# Patient Record
Sex: Female | Born: 1975 | Race: White | Hispanic: No | Marital: Single | Smoking: Never smoker
Health system: Southern US, Community
[De-identification: ages and names within clinical notes are randomized; demographics above are authoritative.]

## PROBLEM LIST (undated history)

## (undated) DIAGNOSIS — F4310 Post-traumatic stress disorder, unspecified: Secondary | ICD-10-CM

## (undated) DIAGNOSIS — M461 Sacroiliitis, not elsewhere classified: Secondary | ICD-10-CM

## (undated) DIAGNOSIS — R61 Generalized hyperhidrosis: Secondary | ICD-10-CM

## (undated) DIAGNOSIS — L811 Chloasma: Secondary | ICD-10-CM

## (undated) DIAGNOSIS — M1990 Unspecified osteoarthritis, unspecified site: Secondary | ICD-10-CM

## (undated) DIAGNOSIS — D249 Benign neoplasm of unspecified breast: Secondary | ICD-10-CM

## (undated) DIAGNOSIS — G4730 Sleep apnea, unspecified: Secondary | ICD-10-CM

## (undated) DIAGNOSIS — G4733 Obstructive sleep apnea (adult) (pediatric): Secondary | ICD-10-CM

## (undated) DIAGNOSIS — F909 Attention-deficit hyperactivity disorder, unspecified type: Secondary | ICD-10-CM

## (undated) DIAGNOSIS — G5700 Lesion of sciatic nerve, unspecified lower limb: Secondary | ICD-10-CM

## (undated) DIAGNOSIS — C801 Malignant (primary) neoplasm, unspecified: Secondary | ICD-10-CM

## (undated) DIAGNOSIS — I10 Essential (primary) hypertension: Secondary | ICD-10-CM

## (undated) DIAGNOSIS — T7840XA Allergy, unspecified, initial encounter: Secondary | ICD-10-CM

## (undated) DIAGNOSIS — F419 Anxiety disorder, unspecified: Secondary | ICD-10-CM

## (undated) DIAGNOSIS — R87619 Unspecified abnormal cytological findings in specimens from cervix uteri: Secondary | ICD-10-CM

## (undated) DIAGNOSIS — J301 Allergic rhinitis due to pollen: Secondary | ICD-10-CM

## (undated) DIAGNOSIS — F32A Depression, unspecified: Secondary | ICD-10-CM

## (undated) DIAGNOSIS — E559 Vitamin D deficiency, unspecified: Secondary | ICD-10-CM

## (undated) DIAGNOSIS — F329 Major depressive disorder, single episode, unspecified: Secondary | ICD-10-CM

## (undated) HISTORY — DX: Allergic rhinitis due to pollen: J30.1

## (undated) HISTORY — DX: Essential (primary) hypertension: I10

## (undated) HISTORY — PX: BREAST BIOPSY: SHX20

## (undated) HISTORY — DX: Lesion of sciatic nerve, unspecified lower limb: G57.00

## (undated) HISTORY — DX: Malignant (primary) neoplasm, unspecified: C80.1

## (undated) HISTORY — DX: Generalized hyperhidrosis: R61

## (undated) HISTORY — DX: Unspecified osteoarthritis, unspecified site: M19.90

## (undated) HISTORY — DX: Sacroiliitis, not elsewhere classified: M46.1

## (undated) HISTORY — DX: Unspecified abnormal cytological findings in specimens from cervix uteri: R87.619

## (undated) HISTORY — PX: BREAST SURGERY: SHX581

## (undated) HISTORY — DX: Obstructive sleep apnea (adult) (pediatric): G47.33

## (undated) HISTORY — DX: Sleep apnea, unspecified: G47.30

## (undated) HISTORY — DX: Major depressive disorder, single episode, unspecified: F32.9

## (undated) HISTORY — DX: Depression, unspecified: F32.A

## (undated) HISTORY — DX: Allergy, unspecified, initial encounter: T78.40XA

## (undated) HISTORY — DX: Chloasma: L81.1

## (undated) HISTORY — DX: Vitamin D deficiency, unspecified: E55.9

## (undated) HISTORY — DX: Attention-deficit hyperactivity disorder, unspecified type: F90.9

## (undated) HISTORY — DX: Post-traumatic stress disorder, unspecified: F43.10

## (undated) HISTORY — DX: Anxiety disorder, unspecified: F41.9

## (undated) HISTORY — DX: Benign neoplasm of unspecified breast: D24.9

---

## 1996-09-28 DIAGNOSIS — C801 Malignant (primary) neoplasm, unspecified: Secondary | ICD-10-CM

## 1996-09-28 HISTORY — DX: Malignant (primary) neoplasm, unspecified: C80.1

## 1996-09-28 HISTORY — PX: OTHER SURGICAL HISTORY: SHX169

## 2013-02-26 HISTORY — PX: KNEE ARTHROSCOPY: SUR90

## 2013-05-29 DIAGNOSIS — R87619 Unspecified abnormal cytological findings in specimens from cervix uteri: Secondary | ICD-10-CM

## 2013-05-29 HISTORY — DX: Unspecified abnormal cytological findings in specimens from cervix uteri: R87.619

## 2013-05-29 HISTORY — PX: COLPOSCOPY: SHX161

## 2013-11-26 HISTORY — PX: HIP ARTHROSCOPY W/ LABRAL REPAIR: SHX1750

## 2017-06-11 ENCOUNTER — Ambulatory Visit: Payer: Self-pay | Admitting: Family Medicine

## 2017-06-16 ENCOUNTER — Telehealth: Payer: Self-pay | Admitting: Family Medicine

## 2017-06-16 NOTE — Telephone Encounter (Signed)
MEDICATION: Olmesartan 40 MG 1 pill a day                          Meloxicam 15 MG 1 pill a day                          Valacyclovir 500 MG 1 pill a day                          Terazosin 10 MG 1 pill a day  PHARMACY:   [id number] - [place], [place] - [place] [phone] (Phone) [phone] (Fax)    IS THIS A 90 DAY SUPPLY : N  IS PATIENT OUT OF MEDICATION: N  IF NOT; HOW MUCH IS LEFT: will run out prior to her appointment. 4 of one med left and 5 of the rest  LAST APPOINTMENT DATE: N/A  NEXT APPOINTMENT DATE:@9 /27/2018  OTHER COMMENTS: I informed patient that I did not know if Dr. Juleen China could fill these since she has not done so previously. Patient stated she had spoke with someone who helped her with scheduling and was told that her prescriptions could be filled prior to her appointment since we had to change her appointment twice. Patient stated she just needs enough to get her through until her appointment on 06/24/17. Please call patient and advise if able to be filled.   **Let patient know to contact pharmacy at the end of the day to make sure medication is ready. **  ** Please notify patient to allow 48-72 hours to process**  **Encourage patient to contact the pharmacy for refills or they can request refills through [facility]**

## 2017-06-16 NOTE — Telephone Encounter (Signed)
Left message on patients phone that we would not be able to refill medications until she is seen by Dr. Juleen China. I left the phone number for her to call the office to see if we can get her in sooner.

## 2017-06-21 ENCOUNTER — Ambulatory Visit (INDEPENDENT_AMBULATORY_CARE_PROVIDER_SITE_OTHER): Payer: BC Managed Care – PPO | Admitting: Family Medicine

## 2017-06-21 ENCOUNTER — Encounter: Payer: Self-pay | Admitting: Family Medicine

## 2017-06-21 VITALS — BP 118/64 | HR 89 | Temp 98.4°F | Ht 65.0 in | Wt 208.8 lb

## 2017-06-21 DIAGNOSIS — I10 Essential (primary) hypertension: Secondary | ICD-10-CM

## 2017-06-21 DIAGNOSIS — F331 Major depressive disorder, recurrent, moderate: Secondary | ICD-10-CM | POA: Diagnosis not present

## 2017-06-21 DIAGNOSIS — Z1231 Encounter for screening mammogram for malignant neoplasm of breast: Secondary | ICD-10-CM

## 2017-06-21 DIAGNOSIS — R61 Generalized hyperhidrosis: Secondary | ICD-10-CM

## 2017-06-21 DIAGNOSIS — Z1239 Encounter for other screening for malignant neoplasm of breast: Secondary | ICD-10-CM

## 2017-06-21 DIAGNOSIS — Z23 Encounter for immunization: Secondary | ICD-10-CM | POA: Diagnosis not present

## 2017-06-21 DIAGNOSIS — Z85820 Personal history of malignant melanoma of skin: Secondary | ICD-10-CM | POA: Diagnosis not present

## 2017-06-21 DIAGNOSIS — J301 Allergic rhinitis due to pollen: Secondary | ICD-10-CM | POA: Diagnosis not present

## 2017-06-21 DIAGNOSIS — Z975 Presence of (intrauterine) contraceptive device: Secondary | ICD-10-CM

## 2017-06-21 DIAGNOSIS — E669 Obesity, unspecified: Secondary | ICD-10-CM

## 2017-06-21 DIAGNOSIS — L74519 Primary focal hyperhidrosis, unspecified: Secondary | ICD-10-CM | POA: Diagnosis not present

## 2017-06-21 DIAGNOSIS — M659 Synovitis and tenosynovitis, unspecified: Secondary | ICD-10-CM

## 2017-06-21 MED ORDER — TERAZOSIN HCL 10 MG PO CAPS: 10.0000 mg | ORAL_CAPSULE | Freq: Every day | ORAL | 0 refills | Status: DC

## 2017-06-21 MED ORDER — AZELASTINE HCL 0.1 % NA SOLN: 1.0000 | Freq: Every day | NASAL | 3 refills | Status: DC

## 2017-06-21 MED ORDER — MELOXICAM 15 MG PO TABS: 15.0000 mg | ORAL_TABLET | Freq: Every day | ORAL | 1 refills | Status: DC

## 2017-06-21 MED ORDER — VALACYCLOVIR HCL 500 MG PO TABS: 500.0000 mg | ORAL_TABLET | Freq: Every day | ORAL | 1 refills | Status: DC

## 2017-06-21 MED ORDER — FLUTICASONE PROPIONATE 50 MCG/ACT NA SUSP: 1.0000 | Freq: Every day | NASAL | 2 refills | Status: DC

## 2017-06-21 MED ORDER — METRONIDAZOLE 500 MG PO TABS: 500.0000 mg | ORAL_TABLET | Freq: Two times a day (BID) | ORAL | 0 refills | Status: DC

## 2017-06-21 MED ORDER — VALACYCLOVIR HCL 500 MG PO TABS: 500.0000 mg | ORAL_TABLET | Freq: Every day | ORAL | 0 refills | Status: DC

## 2017-06-21 MED ORDER — CLONAZEPAM 0.5 MG PO TABS
0.5000 mg | ORAL_TABLET | Freq: Two times a day (BID) | ORAL | 0 refills | Status: AC | PRN
Start: 2017-06-21 — End: ?

## 2017-06-21 MED ORDER — FLUCONAZOLE 150 MG PO TABS: 150.0000 mg | ORAL_TABLET | Freq: Once | ORAL | 0 refills | Status: AC

## 2017-06-21 MED ORDER — BUPROPION HCL ER (XL) 150 MG PO TB24: 150.0000 mg | ORAL_TABLET | Freq: Three times a day (TID) | ORAL | 1 refills | Status: DC

## 2017-06-21 MED ORDER — OLMESARTAN MEDOXOMIL 40 MG PO TABS: 40.0000 mg | ORAL_TABLET | Freq: Every day | ORAL | 1 refills | Status: DC

## 2017-06-21 MED ORDER — MELOXICAM 15 MG PO TABS: 15.0000 mg | ORAL_TABLET | Freq: Every day | ORAL | 0 refills | Status: DC

## 2017-06-21 MED ORDER — MONTELUKAST SODIUM 10 MG PO TABS: 10.0000 mg | ORAL_TABLET | Freq: Every day | ORAL | 1 refills | Status: DC

## 2017-06-21 MED ORDER — TERAZOSIN HCL 10 MG PO CAPS: 10.0000 mg | ORAL_CAPSULE | Freq: Every day | ORAL | 1 refills | Status: DC

## 2017-06-21 MED ORDER — FLUOCIN-HYDROQUINONE-TRETINOIN 0.01-4-0.05 % EX CREA: TOPICAL_CREAM | CUTANEOUS | 1 refills | Status: DC

## 2017-06-21 MED ORDER — OLMESARTAN MEDOXOMIL 40 MG PO TABS: 40.0000 mg | ORAL_TABLET | Freq: Every day | ORAL | 0 refills | Status: DC

## 2017-06-21 NOTE — Progress Notes (Signed)
Jill Frank is a 41 y.o. female is here to [employer].   Patient Care Team: Briscoe Deutscher, DO as PCP - General (Family Medicine)   History of Present Illness:   Shaune Pascal CMA acting as scribe for Dr. Juleen China.  HPI: Patient comes in today to establish care. Patient moves here from [place] for her job. 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 Wanting prescriptions for Flagyl and diflucan for recurrent bacterial vaginitis. She is needing refill on all medications.   Hypertension: Patient has been taking Benicar. She has missed 3 days of medication. She stated that she did not have blood pressure before she gained weight. She notice the blood pressure going up once she got between 180-190 pounds.   Weight gain: Spoke with patient about going on medication for weight loss.   Injection: Patient is needing injection in elbow. Will put in referral for sports medicine.   Health Maintenance Due  Topic Date Due  . HIV Screening  01/25/1991  . PAP SMEAR  01/24/1997   Depression screen PHQ 2/9 06/21/2017  Decreased Interest 2  Down, Depressed, Hopeless 1  PHQ - 2 Score 3  Altered sleeping 2  Tired, decreased energy 3  Change in appetite 2  Feeling bad or failure about yourself  1  Trouble concentrating 1  Moving slowly or fidgety/restless 1  Suicidal thoughts 0  PHQ-9 Score 13   PMHx, SurgHx, SocialHx, Medications, and Allergies were reviewed in the Visit Navigator and updated as appropriate.   Past Medical History:  Diagnosis Date  . Arthritis   . Cancer ([place])   . Depression   . Hyperhidrosis 06/23/2017  . Hypertension   . Seasonal allergic rhinitis due to pollen 06/23/2017    Past Surgical History:  Procedure Laterality Date  . BREAST SURGERY     breast biopsy   No family history on file. Social History  Substance Use Topics  . Smoking status: Never  Smoker  . Smokeless tobacco: Never Used  . Alcohol use Yes     Comment: Social     Current Medications and Allergies:   .  ALPRAZolam (XANAX) 0.5 MG tablet, Take 0.5 mg by mouth as needed for anxiety (For air travel.)., Disp: , Rfl:  .  azelastine (ASTELIN) 0.1 % nasal spray, Place 1 spray into both nostrils daily. Use in each nostril as directed, Disp: 30 mL, Rfl: 3 .  buPROPion (WELLBUTRIN XL) 150 MG 24 hr tablet, Take 1 tablet (150 mg total) by mouth 3 (three) times daily., Disp: 270 tablet, Rfl: 1 .  clonazePAM (KLONOPIN) 0.5 MG tablet, Take 1 tablet (0.5 mg total) by mouth 2 (two) times daily as needed for anxiety., Disp: 180 tablet, Rfl: 0 .  Cyanocobalamin (VITAMIN B 12 PO), Take by mouth., Disp: , Rfl:  .  Fluocin-Hydroquinone-Tretinoin (TRI-LUMA) 0.01-4-0.05 % CREA, Apply daily as needed., Disp: 90 g, Rfl: 1 .  fluticasone (FLONASE) 50 MCG/ACT nasal spray, Place 1 spray into both nostrils daily., Disp: 16 g, Rfl: 2 .  levonorgestrel (MIRENA) 20 MCG/24HR IUD, 1 each by Intrauterine route once., Disp: , Rfl:  .  meloxicam (MOBIC) 15 MG tablet, Take 1 tablet (15 mg total) by mouth daily., Disp: 90 tablet, Rfl: 1 .  montelukast (SINGULAIR) 10 MG tablet, Take 1 tablet (10 mg total) by mouth at bedtime., Disp: 90 tablet, Rfl:  1 .  Multiple Vitamins-Minerals (VITAMIN D3 COMPLETE PO), Take by mouth., Disp: , Rfl:  .  olmesartan (BENICAR) 40 MG tablet, Take 1 tablet (40 mg total) by mouth daily., Disp: 90 tablet, Rfl: 1 .  OnabotulinumtoxinA (BOTOX IJ), Inject as directed., Disp: , Rfl:  .  terazosin (HYTRIN) 10 MG capsule, Take 1 capsule (10 mg total) by mouth at bedtime., Disp: 90 capsule, Rfl: 1 .  valACYclovir (VALTREX) 500 MG tablet, Take 1 tablet (500 mg total) by mouth daily., Disp: 90 tablet, Rfl: 1 .  DULoxetine (CYMBALTA) 30 MG capsule, Take 1 capsule (30 mg total) by mouth 3 (three) times daily., Disp: 270 capsule, Rfl: 1 .  metroNIDAZOLE (FLAGYL) 500 MG tablet, Take 1 tablet (500  mg total) by mouth 2 (two) times daily., Disp: 14 tablet, Rfl: 0  Allergies  Allergen Reactions  . Azithromycin Hives  . Doxycycline Hives  . Penicillins Hives   Review of Systems:   Pertinent items are noted in the HPI. Otherwise, ROS is negative.  Vitals:   Vitals:   06/21/17 1130  BP: 118/64  Pulse: 89  Temp: 98.4 F (36.9 C)  TempSrc: Oral  SpO2: 98%  Weight: 208 lb 12.8 oz (94.7 kg)  Height: _0  (1.651 m)     Body mass index is 34.75 kg/m.   Physical Exam:   Physical Exam  Constitutional: She is oriented to person, place, and time. She appears well-developed and well-nourished. No distress.  HENT:  Head: Normocephalic and atraumatic.  Right Ear: External ear normal.  Left Ear: External ear normal.  Nose: Nose normal.  Mouth/Throat: Oropharynx is clear and moist.  Eyes: Pupils are equal, round, and reactive to light. Conjunctivae and EOM are normal.  Neck: Normal range of motion. Neck supple. No thyromegaly present.  Cardiovascular: Normal rate, regular rhythm, normal heart sounds and intact distal pulses.   Pulmonary/Chest: Effort normal and breath sounds normal.  Abdominal: Soft. Bowel sounds are normal.  Musculoskeletal: Normal range of motion.  Lymphadenopathy:    She has no cervical adenopathy.  Neurological: She is alert and oriented to person, place, and time.  Skin: Skin is warm and dry. Capillary refill takes less than 2 seconds.  Psychiatric: She has a normal mood and affect. Her behavior is normal.  Nursing note and vitals reviewed.  Assessment and Plan:   Jill Frank was seen today for establish care.  Diagnoses and all orders for this visit:  Essential hypertension -     olmesartan (BENICAR) 40 MG tablet; Take 1 tablet (40 mg total) by mouth daily.  Screening for breast cancer -     MM SCREENING BREAST TOMO BILATERAL; Future  Tenosynovitis of left forearm -     Ambulatory referral to Sports Medicine  History of melanoma -     Ambulatory  referral to Dermatology -     meloxicam (MOBIC) 15 MG tablet; Take 1 tablet (15 mg total) by mouth daily.  IUD (intrauterine device) in place -     Ambulatory referral to Obstetrics / Gynecology  Need for measles-mumps-rubella (MMR) vaccine -     MMR vaccine subcutaneous  Need for DTaP vaccination -     Tdap vaccine greater than or equal to 7yo IM  Need for varicella vaccine -     Varicella vaccine subcutaneous  Need for immunization against influenza -     Flu Vaccine QUAD 36+ mos IM  Moderate episode of recurrent major depressive disorder (HCC) -     clonazePAM (  KLONOPIN) 0.5 MG tablet; Take 1 tablet (0.5 mg total) by mouth 2 (two) times daily as needed for anxiety. -     buPROPion (WELLBUTRIN XL) 150 MG 24 hr tablet; Take 1 tablet (150 mg total) by mouth 3 (three) times daily.  Obesity (BMI 30-39.9)  Seasonal allergic rhinitis due to pollen -     montelukast (SINGULAIR) 10 MG tablet; Take 1 tablet (10 mg total) by mouth at bedtime. -     fluticasone (FLONASE) 50 MCG/ACT nasal spray; Place 1 spray into both nostrils daily. -     azelastine (ASTELIN) 0.1 % nasal spray; Place 1 spray into both nostrils daily. Use in each nostril as directed  Hyperhidrosis -     terazosin (HYTRIN) 10 MG capsule; Take 1 capsule (10 mg total) by mouth at bedtime.   . Reviewed expectations re: course of current medical issues. . Discussed self-management of symptoms. . Outlined signs and symptoms indicating need for more acute intervention. . Patient verbalized understanding and all questions were answered. Marland Kitchen Health Maintenance issues including appropriate healthy diet, exercise, and smoking avoidance were discussed with patient. . See orders for this visit as documented in the electronic medical record. . Patient received an After Visit Summary.  CMA served as Education administrator during this visit. History, Physical, and Plan performed by medical provider. The above documentation has been reviewed and is  accurate and complete. Briscoe Deutscher, D.O.  Briscoe Deutscher, DO [place] [place] 06/23/2017  Future Appointments Date Time Provider [place]  06/28/2017 9:00 AM Gerda Diss, DO [facility] None  06/30/2017 2:10 PM [facility] MM 3 [facility]  07/19/2017 1:00 PM Briscoe Deutscher, DO [facility] None  12/20/2017 1:00 PM Briscoe Deutscher, DO [facility] None

## 2017-06-22 ENCOUNTER — Telehealth: Payer: Self-pay | Admitting: Obstetrics and Gynecology

## 2017-06-22 ENCOUNTER — Telehealth: Payer: Self-pay | Admitting: Family Medicine

## 2017-06-22 ENCOUNTER — Encounter: Payer: Self-pay | Admitting: Family Medicine

## 2017-06-22 MED ORDER — DULOXETINE HCL 30 MG PO CPEP: 30.0000 mg | ORAL_CAPSULE | Freq: Three times a day (TID) | ORAL | 1 refills | Status: DC

## 2017-06-22 NOTE — Telephone Encounter (Signed)
Pt calling to state medication was not filled with the rest of them yesterday. Asking for DULoxetine (CYMBALTA) 30 MG capsule [[id number]] to be sent to [place], [place] to [facility].

## 2017-06-22 NOTE — Telephone Encounter (Signed)
Patient left voicemail during lunch returning your call.

## 2017-06-22 NOTE — Telephone Encounter (Signed)
Called and left a message for patient to call back and ask for Starla to schedule a new patient doctor referral. Need to confirm with patient the reason for the referral.

## 2017-06-22 NOTE — Telephone Encounter (Signed)
RX sent to pharmacy  

## 2017-06-22 NOTE — Telephone Encounter (Signed)
Called and left a message for patient to call back to schedule a new patient doctor referral. °

## 2017-06-23 ENCOUNTER — Encounter: Payer: Self-pay | Admitting: Family Medicine

## 2017-06-23 DIAGNOSIS — J301 Allergic rhinitis due to pollen: Secondary | ICD-10-CM | POA: Insufficient documentation

## 2017-06-23 DIAGNOSIS — F32A Depression, unspecified: Secondary | ICD-10-CM | POA: Insufficient documentation

## 2017-06-23 DIAGNOSIS — F329 Major depressive disorder, single episode, unspecified: Secondary | ICD-10-CM | POA: Insufficient documentation

## 2017-06-23 DIAGNOSIS — R61 Generalized hyperhidrosis: Secondary | ICD-10-CM

## 2017-06-23 DIAGNOSIS — I10 Essential (primary) hypertension: Secondary | ICD-10-CM | POA: Insufficient documentation

## 2017-06-23 HISTORY — DX: Allergic rhinitis due to pollen: J30.1

## 2017-06-23 HISTORY — DX: Generalized hyperhidrosis: R61

## 2017-06-23 NOTE — Telephone Encounter (Signed)
Called and left a message for patient to call back to schedule a new patient doctor referral. °

## 2017-06-24 ENCOUNTER — Ambulatory Visit: Payer: Self-pay | Admitting: Family Medicine

## 2017-06-24 NOTE — Telephone Encounter (Signed)
Called and left a message for patient to call back to schedule a new patient doctor referral. °

## 2017-06-28 ENCOUNTER — Ambulatory Visit (INDEPENDENT_AMBULATORY_CARE_PROVIDER_SITE_OTHER): Payer: BC Managed Care – PPO | Admitting: Sports Medicine

## 2017-06-28 ENCOUNTER — Ambulatory Visit: Payer: Self-pay

## 2017-06-28 ENCOUNTER — Ambulatory Visit (INDEPENDENT_AMBULATORY_CARE_PROVIDER_SITE_OTHER): Payer: BC Managed Care – PPO

## 2017-06-28 DIAGNOSIS — M25512 Pain in left shoulder: Secondary | ICD-10-CM

## 2017-06-28 DIAGNOSIS — G8929 Other chronic pain: Secondary | ICD-10-CM

## 2017-06-28 DIAGNOSIS — M25532 Pain in left wrist: Secondary | ICD-10-CM | POA: Diagnosis not present

## 2017-06-28 DIAGNOSIS — M2550 Pain in unspecified joint: Secondary | ICD-10-CM

## 2017-06-28 LAB — C-REACTIVE PROTEIN: CRP: 0.1 mg/dL — ABNORMAL LOW (ref 0.5–20.0)

## 2017-06-28 LAB — SEDIMENTATION RATE: Sed Rate: 4 mm/hr (ref 0–20)

## 2017-06-28 NOTE — Assessment & Plan Note (Signed)
Multiple joints with issues including bilateral shoulders elbows and wrist and hands.  He has marked changes in her cervical spine concerning for potential inflammatory arthropathy especially with a C2 profoundly arthritic right facet joint.  Screening blood work obtained today.  Can consider systemic corticosteroids if persistent ongoing symptoms.

## 2017-06-28 NOTE — Patient Instructions (Addendum)
You had an injection today.  Things to be aware of after injection are listed below: . You may experience no significant improvement or even a slight worsening in your symptoms during the first 24 to 48 hours.  After that we expect your symptoms to improve gradually over the next 2 weeks for the medicine to have its maximal effect.  You should continue to have improvement out to 6 weeks after your injection. . Dr. Paulla Fore recommends icing the site of the injection for 20 minutes  1-2 times the day of your injection . You may shower but no swimming, tub bath or Jacuzzi for 24 hours. . If your bandage falls off this does not need to be replaced.  It is appropriate to remove the bandage after 4 hours. . You may resume light activities as tolerated unless otherwise directed per Dr. Paulla Fore during your visit  POSSIBLE STEROID SIDE EFFECTS:  Side effects from injectable steroids tend to be less than when taken orally however you may experience some of the symptoms listed below.  If experienced these should only last for a short period of time. Change in menstrual flow  Edema (swelling)  Increased appetite Skin flushing (redness)  Skin rash/acne  Thrush (oral) Yeast vaginitis    Increased sweating  Depression Increased blood glucose levels Cramping and leg/calf  Euphoria (feeling happy)  POSSIBLE PROCEDURE SIDE EFFECTS: The side effects of the injection are usually fairly minimal however if you may experience some of the following side effects that are usually self-limited and will is off on their own.  If you are concerned please feel free to call the office with questions:  Increased numbness or tingling  Nausea or vomiting  Swelling or bruising at the injection site   Please call our office if if you experience any of the following symptoms over the next week as these can be signs of infection:   Fever greater than 100.18F  Significant swelling at the injection site  Significant redness or drainage  from the injection site  If after 2 weeks you are continuing to have worsening symptoms please call our office to discuss what the next appropriate actions should be including the potential for a return office visit or other diagnostic testing.     Also check out [employer]" which is a program developed by Dr. Minerva Ends.   There are links to a couple of his YouTube Videos below and I would like to see performing one of his videos 5-6 days per week.    A good intro video is: "Independence from Pain 7-minute Video" - [email or web address]   His more advanced video is: "Powerful Posture and Pain Relief: 12 minutes of Foundation Training" - https://youtu.[email or web address]  Do not try to attempt this entire video when first beginning.    Try breaking of each exercise that he goes into shorter segments.  Otherwise if they perform an exercise for 45 seconds, start with 15 seconds and rest and then resume when they begin the new activity.    If you work your way up to doing this 12 minute video, I expect you will see significant improvements in your pain.  If you enjoy his videos and would like to find out more you can look on his website: [email or web address]  He has a workout streaming option as well as a DVD set available for purchase.  Amazon has the best price for his DVDs.

## 2017-06-28 NOTE — Progress Notes (Signed)
OFFICE VISIT NOTE Jill Frank. Jill, [name] [facility] - 41 y.o. female MRN [id number]  Date of birth: July 03, 1976  Visit Date: 06/28/2017  PCP: Jill Deutscher, DO   Referred by: Jill Deutscher, DO  Burlene Arnt, CMA acting as scribe for Dr. Paulla Fore.  SUBJECTIVE:   Chief Complaint  Patient presents with  . New Patient (Initial Visit)    bilateral elbow pain   HPI: As below and per problem based documentation when appropriate.  Jill Frank is a new patient presenting today for evaluation of LT wrist and shoulder pain.  Sx have been worse over the past 3 months.  The pain is described as mild aching and discomfort. She feels like the wrist catches. She has tenderness to palpation on the radial aspect of the wrist.  Pain is rated as 0/10 at rest but 5/10 with use.  Worsened with movement, even worse when using her thumb.  Improves with rest Therapies tried include : she has had steroid injections in the past. She has tried taking Meloxicam with minimal relief  Other associated symptoms include: Pain does radiate a couple of inches into the radial aspect of the forearm.   Shoulder pain has been present x several years but has gotten worse over the past 3 months or so. She has no known injury or trauma to the shoulder. She just moved to the area the end of June 2018. She relates some of her increased pain to this. Pain is mostly on the anterior and posterior aspect of the shoulder. The pain radiates about half way down the triceps. Pain is worse at night, she is unable to lay on her LT side without her shoulder aching and burning. Pain is worse with internal and external rotation and adduction. Pain worsens the higher she raises her arm. She reports crepitus. Pain improves with rest. She has taken Meloxicam with minimal relief. She has not tried using heat or ice on the area because the pain comes and goes.     No recent xray of the shoulder or wrist.     Review of Systems  Constitutional: Negative.   HENT: Negative.   Eyes: Negative.   Respiratory: Negative.   Cardiovascular: Negative.   Gastrointestinal: Negative.   Genitourinary: Negative.   Musculoskeletal: Positive for back pain, joint pain and neck pain.  Skin: Negative.   Endo/Heme/Allergies: Positive for environmental allergies.    Otherwise per HPI.  HISTORY & PERTINENT PRIOR DATA:  No specialty comments available. She reports that she has never smoked. She has never used smokeless tobacco. No results for input(s): HGBA1C, LABURIC in the last 8760 hours. Allergies reviewed per EMR Prior to Admission medications   Medication Sig Start Date End Date Taking? Authorizing Provider  ALPRAZolam Jill Frank) 0.5 MG tablet Take 0.5 mg by mouth as needed for anxiety (For air travel.).   Yes [provider]  azelastine (ASTELIN) 0.1 % nasal spray Place 1 spray into both nostrils daily. Use in each nostril as directed 06/21/17  Yes Jill Deutscher, DO  buPROPion (WELLBUTRIN XL) 150 MG 24 hr tablet Take 1 tablet (150 mg total) by mouth 3 (three) times daily. 06/21/17  Yes Jill Deutscher, DO  clonazePAM (KLONOPIN) 0.5 MG tablet Take 1 tablet (0.5 mg total) by mouth 2 (two) times daily as needed for anxiety. 06/21/17  Yes Jill Deutscher, DO  Cyanocobalamin (VITAMIN B 12 PO) Take by mouth.   Yes [provider]  DULoxetine (CYMBALTA) 30 MG capsule Take 1 capsule (30 mg total) by mouth 3 (three) times daily. 06/22/17  Yes Jill Deutscher, DO  Fluocin-Hydroquinone-Tretinoin (TRI-LUMA) 0.01-4-0.05 % CREA Apply daily as needed. 06/21/17  Yes Jill Deutscher, DO  fluticasone (FLONASE) 50 MCG/ACT nasal spray Place 1 spray into both nostrils daily. 06/21/17  Yes Jill Deutscher, DO  levonorgestrel (MIRENA) 20 MCG/24HR IUD 1 each by Intrauterine route once.   Yes [provider]  meloxicam (MOBIC) 15 MG tablet Take 1 tablet (15 mg total) by  mouth daily. 06/21/17  Yes Jill Deutscher, DO  metroNIDAZOLE (FLAGYL) 500 MG tablet Take 1 tablet (500 mg total) by mouth 2 (two) times daily. 06/21/17  Yes Jill Deutscher, DO  montelukast (SINGULAIR) 10 MG tablet Take 1 tablet (10 mg total) by mouth at bedtime. 06/21/17  Yes Jill Deutscher, DO  Multiple Vitamins-Minerals (VITAMIN D3 COMPLETE PO) Take by mouth.   Yes [provider]  olmesartan (BENICAR) 40 MG tablet Take 1 tablet (40 mg total) by mouth daily. 06/21/17  Yes Jill Deutscher, DO  OnabotulinumtoxinA (BOTOX IJ) Inject as directed.   Yes [provider]  terazosin (HYTRIN) 10 MG capsule Take 1 capsule (10 mg total) by mouth at bedtime. 06/21/17  Yes Jill Deutscher, DO  valACYclovir (VALTREX) 500 MG tablet Take 1 tablet (500 mg total) by mouth daily. 06/21/17  Yes Jill Deutscher, DO  lisdexamfetamine (VYVANSE) 20 MG capsule Take 1 capsule (20 mg total) by mouth daily. 07/19/17   Jill Deutscher, DO   Patient Active Problem List   Diagnosis Date Noted  . Polyarthralgia 06/28/2017  . Left wrist pain 06/28/2017  . Obesity (BMI 30-39.9) 06/23/2017  . Seasonal allergic rhinitis due to pollen 06/23/2017  . Hyperhidrosis 06/23/2017  . Hypertension   . Depression    Past Medical History:  Diagnosis Date  . Arthritis   . Cancer ([place])   . Depression   . Hyperhidrosis 06/23/2017  . Hypertension   . Seasonal allergic rhinitis due to pollen 06/23/2017   Family History  Problem Relation Age of Onset  . Breast cancer Maternal Aunt   . Breast cancer Maternal Grandmother    Past Surgical History:  Procedure Laterality Date  . BREAST BIOPSY Right    benign  . BREAST SURGERY     breast biopsy   Social History   Occupational History  . Not on file.   Social History Main Topics  . Smoking status: Never Smoker  . Smokeless tobacco: Never Used  . Alcohol use Yes     Comment: Social   . Drug use: No  . Sexual activity: Not on file    OBJECTIVE:  VS:  HT:    WT:    BMI:     BP:   HR: bpm  TEMP: ( )  RESP:  EXAM: Findings:  WDWN, NAD, Non-toxic appearing Alert & appropriately interactive Not depressed or anxious appearing No increased work of breathing. Pupils are equal. EOM intact without nystagmus No clubbing or cyanosis of the extremities appreciated No significant rashes/lesions/ulcerations overlying the examined area. Radial pulses 2+/4.  No significant generalized UE edema. Sensation intact to light touch in upper extremities.  Neck and bilateral arms: She has somewhat limited range of motion of the cervical spine.  Good flexion and extension of the elbows.  No significant pain along the lateral epicondyles bilaterally.  Small amount of pain with palpation of the extensor tendons but this is minimal.  Markedly painful Wynn Maudlin testing  on the left, only mild on the right.  Grip strength is intact.  Upper extremity sensation intact light touch.  She has no pain with Spurling's compression test or Lhermitte's compression test.  Minimal pain with brachial plexus squeeze, no pain with arm squeeze test.    RADIOLOGY: [place] LIMITED JOINT SPACE STRUCTURES UP LEFT(NO LINKED CHARGES) Gerda Diss, DO     07/21/2017  1:16 AM PROCEDURE NOTE -  ULTRASOUND GUIDEDINJECTION: Left de  Quervain's, first dorsal compartment injection Images were obtained and interpreted by myself, Teresa Coombs, DO   Images have been saved and stored to PACS system. Images obtained on: GE S7 Ultrasound machine  ULTRASOUND FINDINGS: Swollen first dorsal compartment  tenosynovitis with normal-appearing tendon.  DESCRIPTION OF PROCEDURE:  The patient's clinical condition is marked by substantial pain  and/or significant functional disability. Other conservative  therapy has not provided relief, is contraindicated, or not  appropriate. There is a reasonable likelihood that injection will  significantly improve the patient's pain and/or functional  impairment. After  discussing the risks, benefits and expected  outcomes of the injection and all questions were reviewed and  answered, the patient wished to undergo the above named  procedure. Verbal consent was obtained. The ultrasound was used  to identify the target structure and adjacent neurovascular  structures. The skin was then prepped in sterile fashion and the  target structure was injected under direct visualization using  sterile technique as below: PREP: Alcohol, Ethel Chloride APPROACH: Direct inplane, single injection, 25g 1.5" needle INJECTATE:0.5cc 1% lidocaine, 0.5cc 0.5% marcaine, 0.5cc 40mg   DepoMedrol ASPIRATE: N/A DRESSING: Band-Aid  Post procedural instructions including recommending icing and  warning signs for infection were reviewed. This procedure was  well tolerated and there were no complications.   IMPRESSION: Succesful US Guided Injection    ASSESSMENT & PLAN:     ICD-10-CM   1. Chronic left shoulder pain M25.512 DG Cervical Spine Complete   G89.29 DG Hand Complete Left  2. Left wrist pain M25.532 [place] LIMITED JOINT SPACE STRUCTURES UP LEFT(NO LINKED CHARGES)    DG Hand Complete Left  3. Polyarthralgia M25.50 Sedimentation rate    C-reactive protein    Rheumatoid factor    Cyclic citrul peptide antibody, IgG    ANA   ================================================================= Polyarthralgia Multiple joints with issues including bilateral shoulders elbows and wrist and hands.  He has marked changes in her cervical spine concerning for potential inflammatory arthropathy especially with a C2 profoundly arthritic right facet joint.  Screening blood work obtained today.  Can consider systemic corticosteroids if persistent ongoing symptoms.  PROCEDURE NOTE -  ULTRASOUND GUIDEDINJECTION: Left de Quervain's, first dorsal compartment injection Images were obtained and interpreted by myself, Teresa Coombs, DO  Images have been saved and stored to PACS  system. Images obtained on: GE S7 Ultrasound machine  ULTRASOUND FINDINGS: Swollen first dorsal compartment tenosynovitis with normal-appearing tendon.  DESCRIPTION OF PROCEDURE:  The patient's clinical condition is marked by substantial pain and/or significant functional disability. Other conservative therapy has not provided relief, is contraindicated, or not appropriate. There is a reasonable likelihood that injection will significantly improve the patient's pain and/or functional impairment. After discussing the risks, benefits and expected outcomes of the injection and all questions were reviewed and answered, the patient wished to undergo the above named procedure. Verbal consent was obtained. The ultrasound was used to identify the target structure and adjacent neurovascular structures. The skin was then prepped in sterile fashion and the target structure was injected  under direct visualization using sterile technique as below: PREP: Alcohol, Ethel Chloride APPROACH: Direct inplane, single injection, 25g 1.5" needle INJECTATE:0.5cc 1% lidocaine, 0.5cc 0.5% marcaine, 0.5cc 40mg  DepoMedrol ASPIRATE: N/A DRESSING: Band-Aid  Post procedural instructions including recommending icing and warning signs for infection were reviewed. This procedure was well tolerated and there were no complications.   IMPRESSION: Succesful US Guided Injection    ================================================================= Patient Instructions  You had an injection today.  Things to be aware of after injection are listed below: . You may experience no significant improvement or even a slight worsening in your symptoms during the first 24 to 48 hours.  After that we expect your symptoms to improve gradually over the next 2 weeks for the medicine to have its maximal effect.  You should continue to have improvement out to 6 weeks after your injection. . Dr. Paulla Fore recommends icing the site of the injection for  20 minutes  1-2 times the day of your injection . You may shower but no swimming, tub bath or Jacuzzi for 24 hours. . If your bandage falls off this does not need to be replaced.  It is appropriate to remove the bandage after 4 hours. . You may resume light activities as tolerated unless otherwise directed per Dr. Paulla Fore during your visit  POSSIBLE STEROID SIDE EFFECTS:  Side effects from injectable steroids tend to be less than when taken orally however you may experience some of the symptoms listed below.  If experienced these should only last for a short period of time. Change in menstrual flow  Edema (swelling)  Increased appetite Skin flushing (redness)  Skin rash/acne  Thrush (oral) Yeast vaginitis    Increased sweating  Depression Increased blood glucose levels Cramping and leg/calf  Euphoria (feeling happy)  POSSIBLE PROCEDURE SIDE EFFECTS: The side effects of the injection are usually fairly minimal however if you may experience some of the following side effects that are usually self-limited and will is off on their own.  If you are concerned please feel free to call the office with questions:  Increased numbness or tingling  Nausea or vomiting  Swelling or bruising at the injection site   Please call our office if if you experience any of the following symptoms over the next week as these can be signs of infection:   Fever greater than 100.34F  Significant swelling at the injection site  Significant redness or drainage from the injection site  If after 2 weeks you are continuing to have worsening symptoms please call our office to discuss what the next appropriate actions should be including the potential for a return office visit or other diagnostic testing.     Also check out [employer]" which is a program developed by Dr. Minerva Ends.   There are links to a couple of his YouTube Videos below and I would like to see performing one of his videos 5-6 days per week.     A good intro video is: "Independence from Pain 7-minute Video" - [email or web address]   His more advanced video is: "Powerful Posture and Pain Relief: 12 minutes of Foundation Training" - https://youtu.be/[email or web address]  Do not try to attempt this entire video when first beginning.    Try breaking of each exercise that he goes into shorter segments.  Otherwise if they perform an exercise for 45 seconds, start with 15 seconds and rest and then resume when they begin the new activity.    If you work your way up to doing  this 12 minute video, I expect you will see significant improvements in your pain.  If you enjoy his videos and would like to find out more you can look on his website: [email or web address]  He has a workout streaming option as well as a DVD set available for purchase.  Amazon has the best price for his DVDs.      =================================================================  Follow-up: Return in about 8 weeks (around 08/23/2017).   CMA/ATC served as Education administrator during this visit. History, Physical, and Plan performed by medical provider. Documentation and orders reviewed and attested to.      Teresa Coombs, [facility] Physician

## 2017-06-29 LAB — RHEUMATOID FACTOR: Rhuematoid fact SerPl-aCnc: <14 IU/mL (ref <14)

## 2017-06-29 LAB — CYCLIC CITRUL PEPTIDE ANTIBODY, IGG: Cyclic Citrullin Peptide Ab: <16 UNITS

## 2017-06-29 LAB — ANA: Anti Nuclear Antibody(ANA): NEGATIVE

## 2017-06-30 ENCOUNTER — Ambulatory Visit
Admission: RE | Admit: 2017-06-30 | Discharge: 2017-06-30 | Disposition: A | Discharge status: Home / Self Care | Payer: BC Managed Care – PPO | Source: Ambulatory Visit | Attending: Family Medicine | Admitting: Family Medicine

## 2017-06-30 DIAGNOSIS — Z1239 Encounter for other screening for malignant neoplasm of breast: Secondary | ICD-10-CM

## 2017-07-02 ENCOUNTER — Telehealth: Payer: Self-pay | Admitting: *Deleted

## 2017-07-02 NOTE — Telephone Encounter (Signed)
Called pt and left VM advising that results have been reviewed and Dr. Paulla Fore sent a message through [place].

## 2017-07-02 NOTE — Telephone Encounter (Signed)
Lab work has been annotated.  My chart message sent.  Please call to confirm patient received these results.

## 2017-07-02 NOTE — Telephone Encounter (Signed)
Forwarding to Dr. Paulla Fore to lab results annotation.

## 2017-07-02 NOTE — Telephone Encounter (Signed)
Patient is calling and is inquiring about her lab results. Patient is requesting a call back. Her call back number is [phone]. Please advise. Thank you

## 2017-07-13 ENCOUNTER — Encounter: Payer: Self-pay | Admitting: Family Medicine

## 2017-07-19 ENCOUNTER — Ambulatory Visit: Payer: BC Managed Care – PPO | Admitting: Sports Medicine

## 2017-07-19 ENCOUNTER — Encounter: Payer: Self-pay | Admitting: Family Medicine

## 2017-07-19 ENCOUNTER — Ambulatory Visit (INDEPENDENT_AMBULATORY_CARE_PROVIDER_SITE_OTHER): Payer: BC Managed Care – PPO | Admitting: Family Medicine

## 2017-07-19 VITALS — BP 110/82 | HR 80 | Temp 98.4°F | Ht 65.0 in | Wt 210.0 lb

## 2017-07-19 DIAGNOSIS — Z23 Encounter for immunization: Secondary | ICD-10-CM

## 2017-07-19 DIAGNOSIS — F902 Attention-deficit hyperactivity disorder, combined type: Secondary | ICD-10-CM | POA: Diagnosis not present

## 2017-07-19 DIAGNOSIS — G4733 Obstructive sleep apnea (adult) (pediatric): Secondary | ICD-10-CM

## 2017-07-19 DIAGNOSIS — E669 Obesity, unspecified: Secondary | ICD-10-CM

## 2017-07-19 MED ORDER — VARICELLA VIRUS VACCINE LIVE 1350 PFU/0.5ML IJ SUSR: 0.5000 mL | Freq: Once | INTRAMUSCULAR | 0 refills | Status: AC

## 2017-07-19 MED ORDER — LISDEXAMFETAMINE DIMESYLATE 20 MG PO CAPS: 20.0000 mg | ORAL_CAPSULE | Freq: Every day | ORAL | 0 refills | Status: DC

## 2017-07-19 NOTE — Progress Notes (Signed)
Jill Frank is a 41 y.o. female is here for follow up.  History of Present Illness:   HPI:  1. Attention deficit hyperactivity disorder (ADHD), combined type. Hx of the same. She has not been on medication for several months. Adderall caused some anxiety. She never tried another medication, but is now interested in trying again. She is having trouble focusing at work, not completing tasks.     2. Obesity (BMI 30-39.9). Admits to overeating. Exercises, but not as much as she used to - she ran a marathon three years ago.   Wt Readings from Last 3 Encounters:  07/19/17 210 lb (95.3 kg)  06/21/17 208 lb 12.8 oz (94.7 kg)     Health Maintenance Due  Topic Date Due  . HIV Screening  01/25/1991  . PAP SMEAR  01/24/1997   Depression screen PHQ 2/9 06/21/2017  Decreased Interest 2  Down, Depressed, Hopeless 1  PHQ - 2 Score 3  Altered sleeping 2  Tired, decreased energy 3  Change in appetite 2  Feeling bad or failure about yourself  1  Trouble concentrating 1  Moving slowly or fidgety/restless 1  Suicidal thoughts 0  PHQ-9 Score 13   PMHx, SurgHx, SocialHx, FamHx, Medications, and Allergies were reviewed in the Visit Navigator and updated as appropriate.   Patient Active Problem List   Diagnosis Date Noted  . Polyarthralgia 06/28/2017  . Left wrist pain 06/28/2017  . Obesity (BMI 30-39.9) 06/23/2017  . Seasonal allergic rhinitis due to pollen 06/23/2017  . Hyperhidrosis 06/23/2017  . Hypertension   . Depression    Social History  Substance Use Topics  . Smoking status: Never Smoker  . Smokeless tobacco: Never Used  . Alcohol use Yes     Comment: Social    Current Medications and Allergies:   .  ALPRAZolam (XANAX) 0.5 MG tablet, Take 0.5 mg by mouth as needed for anxiety (For air travel.)., Disp: , Rfl:  .  azelastine (ASTELIN) 0.1 % nasal spray, Place 1 spray into both nostrils daily. Use in each nostril as directed, Disp: 30 mL, Rfl: 3 .  buPROPion (WELLBUTRIN  XL) 150 MG 24 hr tablet, Take 1 tablet (150 mg total) by mouth 3 (three) times daily., Disp: 270 tablet, Rfl: 1 .  clonazePAM (KLONOPIN) 0.5 MG tablet, Take 1 tablet (0.5 mg total) by mouth 2 (two) times daily as needed for anxiety., Disp: 180 tablet, Rfl: 0 .  Cyanocobalamin (VITAMIN B 12 PO), Take by mouth., Disp: , Rfl:  .  DULoxetine (CYMBALTA) 30 MG capsule, Take 1 capsule (30 mg total) by mouth 3 (three) times daily., Disp: 270 capsule, Rfl: 1 .  Fluocin-Hydroquinone-Tretinoin (TRI-LUMA) 0.01-4-0.05 % CREA, Apply daily as needed., Disp: 90 g, Rfl: 1 .  fluticasone (FLONASE) 50 MCG/ACT nasal spray, Place 1 spray into both nostrils daily., Disp: 16 g, Rfl: 2 .  levonorgestrel (MIRENA) 20 MCG/24HR IUD, 1 each by Intrauterine route once., Disp: , Rfl:  .  meloxicam (MOBIC) 15 MG tablet, Take 1 tablet (15 mg total) by mouth daily., Disp: 90 tablet, Rfl: 1 .  metroNIDAZOLE (FLAGYL) 500 MG tablet, Take 1 tablet (500 mg total) by mouth 2 (two) times daily., Disp: 14 tablet, Rfl: 0 .  montelukast (SINGULAIR) 10 MG tablet, Take 1 tablet (10 mg total) by mouth at bedtime., Disp: 90 tablet, Rfl: 1 .  Multiple Vitamins-Minerals (VITAMIN D3 COMPLETE PO), Take by mouth., Disp: , Rfl:  .  olmesartan (BENICAR) 40 MG tablet, Take 1 tablet (  40 mg total) by mouth daily., Disp: 90 tablet, Rfl: 1 .  OnabotulinumtoxinA (BOTOX IJ), Inject as directed., Disp: , Rfl:  .  terazosin (HYTRIN) 10 MG capsule, Take 1 capsule (10 mg total) by mouth at bedtime., Disp: 90 capsule, Rfl: 1 .  valACYclovir (VALTREX) 500 MG tablet, Take 1 tablet (500 mg total) by mouth daily., Disp: 90 tablet, Rfl: 1  Allergies  Allergen Reactions  . Doxycycline Hives  . Erythromycin Hives  . Penicillins Hives   Review of Systems   Pertinent items are noted in the HPI. Otherwise, ROS is negative.  Vitals:   Vitals:   07/19/17 1305  BP: 110/82  Pulse: 80  Temp: 98.4 F (36.9 C)  TempSrc: Oral  SpO2: 98%  Weight: 210 lb (95.3 kg)    Height: 5\' 5"  (1.651 m)     Body mass index is 34.95 kg/m.   Physical Exam:   Physical Exam  Constitutional: She appears well-nourished.  HENT:  Head: Normocephalic and atraumatic.  Eyes: Pupils are equal, round, and reactive to light. EOM are normal.  Neck: Normal range of motion. Neck supple.  Cardiovascular: Normal rate, regular rhythm, normal heart sounds and intact distal pulses.   Pulmonary/Chest: Effort normal.  Abdominal: Soft.  Skin: Skin is warm.  Psychiatric: She has a normal mood and affect. Her behavior is normal.  Nursing note and vitals reviewed.   Assessment and Plan:   Jill Frank was seen today for weight loss, referral to psychiatry and varicella vaccine.  Diagnoses and all orders for this visit:  Attention deficit hyperactivity disorder (ADHD), combined type Comments: The benefits of stimulant medication treatment appear to outweigh the current risks. We discussed risks and benefits of different medications. I recommended and discussed appropriate dietary modifications and routine exercise.  Stimulant medication management, careful titration, and dose optimization of stimulant medication was discussed as the treatment option with the best scientific evidence helping reduce ADHD symptoms. There is no cure for ADHD.  Stimulant medication side effects: The temporary side effects of stimulants including decreased appetite, sleep disturbance, mood changes, personality changes, ticks, increases in heart rate and blood pressure, abdominal pain, nausea, vomiting, and dry mouth were discussed.  Orders: -     lisdexamfetamine (VYVANSE) 20 MG capsule; Take 1 capsule (20 mg total) by mouth daily. -     Ambulatory referral to Psychiatry  Obesity (BMI 30-39.9) Comments: Vyvanse as above. The patient is asked to make an attempt to improve diet and exercise patterns to aid in medical management of this problem.   . Reviewed expectations re: course of current medical  issues. . Discussed self-management of symptoms. . Outlined signs and symptoms indicating need for more acute intervention. . Patient verbalized understanding and all questions were answered. Marland Kitchen Health Maintenance issues including appropriate healthy diet, exercise, and smoking avoidance were discussed with patient. . See orders for this visit as documented in the electronic medical record. . Patient received an After Visit Summary.   Briscoe Deutscher, DO , [place] [facility] 07/19/2017  Future Appointments Date Time Provider [place]  08/09/2017 9:30 AM Salvadore Dom, MD [facility] None  08/23/2017 9:00 AM Gerda Diss, DO [facility] None  12/20/2017 1:00 PM Briscoe Deutscher, DO [facility] None

## 2017-07-20 ENCOUNTER — Telehealth: Payer: Self-pay

## 2017-07-20 NOTE — Telephone Encounter (Signed)
PA for Vyvanse approved through 07/19/2020.  Patient's pharmacy notified.

## 2017-07-20 NOTE — Telephone Encounter (Signed)
I can send it to either [facility], or [facility]. Both have staff APPs currently taking new patients as well as physicians, so it would be a toss up. Advise and I will send it where needed.

## 2017-07-20 NOTE — Telephone Encounter (Signed)
Referral faxed to [facility], requesting only a physician.

## 2017-07-21 ENCOUNTER — Other Ambulatory Visit: Payer: Self-pay | Admitting: Family Medicine

## 2017-07-21 DIAGNOSIS — G4733 Obstructive sleep apnea (adult) (pediatric): Secondary | ICD-10-CM | POA: Insufficient documentation

## 2017-07-21 NOTE — Procedures (Signed)
PROCEDURE NOTE -  ULTRASOUND GUIDEDINJECTION: Left de Quervain's, first dorsal compartment injection Images were obtained and interpreted by myself, Teresa Coombs, DO  Images have been saved and stored to PACS system. Images obtained on: GE S7 Ultrasound machine  ULTRASOUND FINDINGS: Swollen first dorsal compartment tenosynovitis with normal-appearing tendon.  DESCRIPTION OF PROCEDURE:  The patient's clinical condition is marked by substantial pain and/or significant functional disability. Other conservative therapy has not provided relief, is contraindicated, or not appropriate. There is a reasonable likelihood that injection will significantly improve the patient's pain and/or functional impairment. After discussing the risks, benefits and expected outcomes of the injection and all questions were reviewed and answered, the patient wished to undergo the above named procedure. Verbal consent was obtained. The ultrasound was used to identify the target structure and adjacent neurovascular structures. The skin was then prepped in sterile fashion and the target structure was injected under direct visualization using sterile technique as below: PREP: Alcohol, Ethel Chloride APPROACH: Direct inplane, single injection, 25g 1.5" needle INJECTATE:0.5cc 1% lidocaine, 0.5cc 0.5% marcaine, 0.5cc 40mg  DepoMedrol ASPIRATE: N/A DRESSING: Band-Aid  Post procedural instructions including recommending icing and warning signs for infection were reviewed. This procedure was well tolerated and there were no complications.   IMPRESSION: Succesful US Guided Injection

## 2017-07-30 ENCOUNTER — Ambulatory Visit: Payer: BC Managed Care – PPO | Admitting: Sports Medicine

## 2017-08-02 ENCOUNTER — Encounter: Payer: Self-pay | Admitting: Family Medicine

## 2017-08-02 ENCOUNTER — Telehealth: Payer: Self-pay

## 2017-08-02 ENCOUNTER — Ambulatory Visit: Payer: BC Managed Care – PPO | Admitting: Family Medicine

## 2017-08-02 VITALS — BP 112/68 | HR 95 | Temp 98.7°F | Ht 65.0 in | Wt 208.0 lb

## 2017-08-02 DIAGNOSIS — F909 Attention-deficit hyperactivity disorder, unspecified type: Secondary | ICD-10-CM | POA: Insufficient documentation

## 2017-08-02 DIAGNOSIS — J029 Acute pharyngitis, unspecified: Secondary | ICD-10-CM

## 2017-08-02 DIAGNOSIS — F902 Attention-deficit hyperactivity disorder, combined type: Secondary | ICD-10-CM | POA: Diagnosis not present

## 2017-08-02 LAB — POCT RAPID STREP A (OFFICE): Rapid Strep A Screen: NEGATIVE

## 2017-08-02 MED ORDER — LISDEXAMFETAMINE DIMESYLATE 40 MG PO CAPS: 40.0000 mg | ORAL_CAPSULE | ORAL | 0 refills | Status: DC

## 2017-08-02 MED ORDER — AZITHROMYCIN 500 MG PO TABS: 500.0000 mg | ORAL_TABLET | Freq: Every day | ORAL | 0 refills | Status: DC

## 2017-08-02 NOTE — Telephone Encounter (Signed)
PA for Vyvanse in progress.  Waiting for insurance decision.

## 2017-08-02 NOTE — Progress Notes (Signed)
Jill Frank is a 41 y.o. female here for an acute visit.  History of Present Illness:   HPI:   1. Acute pharyngitis.   Patient complains of sore throat. Associated symptoms include low grade fevers, chills and myalgias. Onset of symptoms was 4 days ago, and have been gradually improving since that time. She is drinking plenty of fluids. She has not had recent close exposure to someone with proven streptococcal pharyngitis.   2. Attention deficit hyperactivity disorder (ADHD), combined type.  Since the last visit has the patient had any:  Appetite changes? No Unintentional weight loss? No Is medication working well ? Yes, but thinks that it can work better, still without focus Does patient take drug holidays? No Difficulties falling to sleep or maintaining sleep? No Any anxiety?  No Any cardiac issues (fainting or paliptations)? No Suicidal thoughts? No Changes in health since last visit? No New medications? No Any illicit substance abuse? No Has the patient taken his medication today? Yes    PMHx, SurgHx, SocialHx, Medications, and Allergies were reviewed in the Visit Navigator and updated as appropriate.  Current Medications:   .  ALPRAZolam (XANAX) 0.5 MG tablet, Take 0.5 mg by mouth as needed for anxiety (For air travel.)., Disp: , Rfl:  .  azelastine (ASTELIN) 0.1 % nasal spray, Place 1 spray into both nostrils daily. Use in each nostril as directed, Disp: 30 mL, Rfl: 3 .  buPROPion (WELLBUTRIN XL) 150 MG 24 hr tablet, Take 1 tablet (150 mg total) by mouth 3 (three) times daily., Disp: 270 tablet, Rfl: 1 .  clonazePAM (KLONOPIN) 0.5 MG tablet, Take 1 tablet (0.5 mg total) by mouth 2 (two) times daily as needed for anxiety., Disp: 180 tablet, Rfl: 0 .  Cyanocobalamin (VITAMIN B 12 PO), Take by mouth., Disp: , Rfl:  .  DULoxetine (CYMBALTA) 30 MG capsule, Take 1 capsule (30 mg total) by mouth 3 (three) times daily., Disp: 270 capsule, Rfl: 1 .   Fluocin-Hydroquinone-Tretinoin (TRI-LUMA) 0.01-4-0.05 % CREA, Apply daily as needed., Disp: 90 g, Rfl: 1 .  fluticasone (FLONASE) 50 MCG/ACT nasal spray, Place 1 spray into both nostrils daily., Disp: 16 g, Rfl: 2 .  levonorgestrel (MIRENA) 20 MCG/24HR IUD, 1 each by Intrauterine route once., Disp: , Rfl:  .  lisdexamfetamine (VYVANSE) 20 MG capsule, Take 1 capsule (20 mg total) by mouth daily., Disp: 30 capsule, Rfl: 0 .  meloxicam (MOBIC) 15 MG tablet, Take 1 tablet (15 mg total) by mouth daily., Disp: 90 tablet, Rfl: 1 .  metroNIDAZOLE (FLAGYL) 500 MG tablet, Take 1 tablet (500 mg total) by mouth 2 (two) times daily., Disp: 14 tablet, Rfl: 0 .  montelukast (SINGULAIR) 10 MG tablet, Take 1 tablet (10 mg total) by mouth at bedtime., Disp: 90 tablet, Rfl: 1 .  Multiple Vitamins-Minerals (VITAMIN D3 COMPLETE PO), Take by mouth., Disp: , Rfl:  .  olmesartan (BENICAR) 40 MG tablet, Take 1 tablet (40 mg total) by mouth daily., Disp: 90 tablet, Rfl: 1 .  OnabotulinumtoxinA (BOTOX IJ), Inject as directed., Disp: , Rfl:  .  terazosin (HYTRIN) 10 MG capsule, Take 1 capsule (10 mg total) by mouth at bedtime., Disp: 90 capsule, Rfl: 1 .  valACYclovir (VALTREX) 500 MG tablet, Take 1 tablet (500 mg total) by mouth daily., Disp: 90 tablet, Rfl: 1   Allergies  Allergen Reactions  . Doxycycline Hives  . Erythromycin Hives  . Penicillins Hives   Review of Systems:   Pertinent items are noted in  the HPI. Otherwise, ROS is negative.  Vitals:   Vitals:   08/02/17 0844  BP: 112/68  Pulse: 95  Temp: 98.7 F (37.1 C)  TempSrc: Oral  SpO2: 96%  Weight: 208 lb (94.3 kg)  Height: 5\' 5"  (1.651 m)     Body mass index is 34.61 kg/m.   Physical Exam:   Physical Exam  Constitutional: She appears well-developed and well-nourished. No distress.  HENT:  Head: Normocephalic and atraumatic.  Right Ear: External ear normal.  Left Ear: External ear normal.  Nose: Nose normal.  Mouth/Throat: Posterior  oropharyngeal erythema present.  Eyes: Conjunctivae and EOM are normal. Pupils are equal, round, and reactive to light.  Neck: Normal range of motion. Neck supple.  Cardiovascular: Normal rate, regular rhythm, normal heart sounds and intact distal pulses.  Pulmonary/Chest: Effort normal and breath sounds normal.  Abdominal: Soft. Bowel sounds are normal.  Neurological: She is alert.  Skin: Skin is warm.  Psychiatric: She has a normal mood and affect. Her behavior is normal.  Nursing note and vitals reviewed.  Assessment and Plan:   Diagnoses and all orders for this visit:  Acute pharyngitis, unspecified etiology Comments: Likely viral. Symptomatic care and red flags reviewed. Culture pending. Safety net Rx provided.  Orders: -     POCT rapid strep A -     azithromycin (ZITHROMAX) 500 MG tablet; Take 1 tablet (500 mg total) daily by mouth. -     Culture, Group A Strep  Attention deficit hyperactivity disorder (ADHD), combined type Comments: Will increase Vyvanse today as below. Orders: -     lisdexamfetamine (VYVANSE) 40 MG capsule; Take 1 capsule (40 mg total) every morning by mouth.   . Reviewed expectations re: course of current medical issues. . Discussed self-management of symptoms. . Outlined signs and symptoms indicating need for more acute intervention. . Patient verbalized understanding and all questions were answered. Marland Kitchen Health Maintenance issues including appropriate healthy diet, exercise, and smoking avoidance were discussed with patient. . See orders for this visit as documented in the electronic medical record. . Patient received an After Visit Summary.  Briscoe Deutscher, DO [facility] 08/02/2017  Future Appointments  Date Time Provider [place]  08/04/2017  3:00 PM Gerda Diss, DO [facility] None  08/09/2017  9:30 AM Salvadore Dom, MD [place] None  08/23/2017  9:00 AM Gerda Diss, DO [facility] None  12/20/2017  1:00 PM  Briscoe Deutscher, DO [facility] None

## 2017-08-03 NOTE — Telephone Encounter (Signed)
PA for Vyvanse approved.  Approval faxed to patient's pharmacy.

## 2017-08-04 ENCOUNTER — Ambulatory Visit: Payer: BC Managed Care – PPO | Admitting: Sports Medicine

## 2017-08-04 ENCOUNTER — Encounter: Payer: Self-pay | Admitting: Family Medicine

## 2017-08-04 LAB — CULTURE, GROUP A STREP
MICRO NUMBER:: 81239917
SPECIMEN QUALITY:: ADEQUATE

## 2017-08-09 ENCOUNTER — Encounter: Payer: Self-pay | Admitting: Obstetrics and Gynecology

## 2017-08-09 ENCOUNTER — Other Ambulatory Visit (HOSPITAL_COMMUNITY)
Admission: RE | Admit: 2017-08-09 | Discharge: 2017-08-09 | Disposition: A | Discharge status: Home / Self Care | Payer: BC Managed Care – PPO | Source: Ambulatory Visit | Attending: Obstetrics and Gynecology | Admitting: Obstetrics and Gynecology

## 2017-08-09 ENCOUNTER — Ambulatory Visit (INDEPENDENT_AMBULATORY_CARE_PROVIDER_SITE_OTHER): Payer: BC Managed Care – PPO | Admitting: Obstetrics and Gynecology

## 2017-08-09 VITALS — BP 110/78 | HR 90 | Resp 14 | Ht 65.0 in | Wt 210.0 lb

## 2017-08-09 DIAGNOSIS — Z01419 Encounter for gynecological examination (general) (routine) without abnormal findings: Secondary | ICD-10-CM

## 2017-08-09 DIAGNOSIS — Z124 Encounter for screening for malignant neoplasm of cervix: Secondary | ICD-10-CM | POA: Diagnosis not present

## 2017-08-09 DIAGNOSIS — Z30431 Encounter for routine checking of intrauterine contraceptive device: Secondary | ICD-10-CM | POA: Diagnosis not present

## 2017-08-09 DIAGNOSIS — Z114 Encounter for screening for human immunodeficiency virus [HIV]: Secondary | ICD-10-CM | POA: Insufficient documentation

## 2017-08-09 NOTE — Patient Instructions (Signed)

## 2017-08-09 NOTE — Progress Notes (Signed)
Patient ID: Jill Frank, female   DOB: August 23, 1976, 41 y.o.   MRN: [id number] 41 y.o. [id number] SingleCaucasianF here for annual exam.  No cycles with the mirena.  Period Cycle (Days): (Mirena IUD). Sexually active, same partner x 5 years. Occasional deep dyspareunia, positional.   No LMP recorded. Patient is not currently having periods (Reason: IUD).          Sexually active: Yes.    The current method of family planning is IUD--Mirena inserted 02/2014.    Exercising: Yes.    Gym, running, tennis Smoker:  no  Health Maintenance: Pap: 05/2016 normal History of abnormal Pap:  Yes, 05/2013 hx of LGSIL , had colposcopy but not treatment.  MMG:  06/2017 Density D/Neg/BiRads1:TBC Colonoscopy:  n/a BMD:   n/a TDaP:  05/2017 Gardasil: no--interested   reports that  has never smoked. she has never used smokeless tobacco. She reports that she drinks alcohol. She reports that she does not use drugs.Social drinker. Just moved here in August from [place]. 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 Abnormal Pap smear of cervix 05/2013   --had colposcopy for LGSIL but no treatment to cervix  . ADHD   . Anxiety   . Arthritis   . Cancer ([place]) 1998   back  . Depression   . Fibroadenoma    Rt.Breast  . Hyperhidrosis 06/23/2017  . Hyperhidrosis   . Hypertension   . Melasma   . Obstructive sleep apnea   . Piriformis syndrome   . PTSD (post-traumatic stress disorder)   . Sacroiliitis ([place])   . Seasonal allergic rhinitis due to pollen 06/23/2017  . 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 No outbreaks (not even sure if she ever had one, + serology).   Past Surgical History:  Procedure Laterality Date  . BREAST BIOPSY Right    benign  . BREAST SURGERY     breast biopsy  . COLPOSCOPY  05/2013   LGSIL--no treatment  . excision of melanoma  1998   --back  . HIP ARTHROSCOPY W/ LABRAL REPAIR Left 11/2013  . KNEE ARTHROSCOPY  02/2013    Current Outpatient Medications  Medication Sig Dispense Refill  . ALPRAZolam (XANAX) 0.5 MG tablet Take 0.5 mg by mouth as needed for anxiety (For air travel.).    Marland Kitchen azelastine (ASTELIN) 0.1 % nasal spray Place 1 spray into both nostrils daily. Use in each nostril as directed 30 mL 3  . azithromycin (ZITHROMAX) 500 MG tablet Take 1 tablet (500 mg total) daily by mouth. 5 tablet 0  . buPROPion (WELLBUTRIN XL) 150 MG 24 hr tablet Take 1 tablet (150 mg total) by mouth 3 (three) times daily. 270 tablet 1  . clonazePAM (KLONOPIN) 0.5 MG tablet Take 1 tablet (0.5 mg total) by mouth 2 (two) times daily as needed for anxiety. 180 tablet 0  . Cyanocobalamin (VITAMIN B 12 PO) Take by mouth.    . DULoxetine (CYMBALTA) 30 MG capsule Take 1 capsule (30 mg total) by mouth 3 (three) times daily. 270 capsule 1  . Fluocin-Hydroquinone-Tretinoin (TRI-LUMA) 0.01-4-0.05 % CREA Apply daily as needed. 90 g 1  . fluticasone (FLONASE) 50 MCG/ACT nasal spray Place 1 spray into both nostrils daily. 16 g 2  . levonorgestrel (MIRENA) 20 MCG/24HR IUD 1 each by Intrauterine route once.    Marland Kitchen  lisdexamfetamine (VYVANSE) 40 MG capsule Take 1 capsule (40 mg total) every morning by mouth. 30 capsule 0  . meloxicam (MOBIC) 15 MG tablet Take 1 tablet (15 mg total) by mouth daily. 90 tablet 1  . metroNIDAZOLE (FLAGYL) 500 MG tablet Take 1 tablet (500 mg total) by mouth 2 (two) times daily. 14 tablet 0  . montelukast (SINGULAIR) 10 MG tablet Take 1 tablet (10 mg total) by mouth at bedtime. 90 tablet 1  . Multiple Vitamins-Minerals (VITAMIN D3 COMPLETE PO) Take by mouth.    . olmesartan (BENICAR) 40 MG tablet Take 1 tablet (40 mg total) by mouth daily. 90 tablet 1  . OnabotulinumtoxinA (BOTOX IJ) Inject as directed.    . terazosin (HYTRIN) 10 MG capsule Take 1 capsule (10 mg total) by mouth at bedtime. 90 capsule 1  . valACYclovir (VALTREX) 500  MG tablet Take 1 tablet (500 mg total) by mouth daily. 90 tablet 1   No current facility-administered medications for this visit.     Family History  Problem Relation Age of Onset  . Breast cancer Maternal Aunt 26       A & W  . Breast cancer Maternal Grandmother 45       mets to brain  . Autoimmune disease Mother   . Anxiety disorder Father   . CAD Father   . Other Father        dyslipidemia  . Bipolar disorder Sister   . ADD / ADHD Sister   . Thyroid disease Maternal Grandfather   . Cancer Paternal Grandfather 32       dec colon ca    Review of Systems  All other systems reviewed and are negative.   Exam:   BP 110/78 (BP Location: Left Arm, Patient Position: Sitting, Cuff Size: Large)   Pulse 90   Resp 14   Ht 5\' 5"  (1.651 m)   Wt 210 lb (95.3 kg)   BMI 34.95 kg/m   Weight change: @WEIGHTCHANGE @ Height:   Height: 5\' 5"  (165.1 cm)  Ht Readings from Last 3 Encounters:  08/09/17 5\' 5"  (1.651 m)  08/02/17 5\' 5"  (1.651 m)  07/19/17 5\' 5"  (1.651 m)    General appearance: alert, cooperative and appears stated age Head: Normocephalic, without obvious abnormality, atraumatic Neck: no adenopathy, supple, symmetrical, trachea midline and thyroid normal to inspection and palpation Lungs: clear to auscultation bilaterally Cardiovascular: regular rate and rhythm Breasts: normal appearance, no masses or tenderness Abdomen: soft, non-tender; non distended,  no masses,  no organomegaly Extremities: extremities normal, atraumatic, no cyanosis or edema Skin: Skin color, texture, turgor normal. No rashes or lesions Lymph nodes: Cervical, supraclavicular, and axillary nodes normal. No abnormal inguinal nodes palpated Neurologic: Grossly normal   Pelvic: External genitalia:  no lesions              Urethra:  normal appearing urethra with no masses, tenderness or lesions              Bartholins and Skenes: normal                 Vagina: normal appearing vagina with normal color  and discharge, no lesions              Cervix: no lesions and IUD string 3 cm               Bimanual Exam:  Uterus:  normal size, contour, position, consistency, mobility, non-tender and anteverted  Adnexa: no mass, fullness, tenderness               Rectovaginal: Confirms               Anus:  normal sphincter tone, no lesions  Chaperone was present for exam.  A:  Well Woman with normal exam  IUD check  P:   Pap with hpv  Labs with primary  Desires HIV testing, declines other screening  Discussed breast self exam  Discussed calcium and vit D intake  Mammogram is UTD

## 2017-08-10 LAB — HIV ANTIBODY (ROUTINE TESTING W REFLEX): HIV Screen 4th Generation wRfx: NONREACTIVE

## 2017-08-11 LAB — CYTOLOGY - PAP
Diagnosis: NEGATIVE
HPV (WINDOPATH): NOT DETECTED

## 2017-08-23 ENCOUNTER — Ambulatory Visit: Payer: BC Managed Care – PPO | Admitting: Sports Medicine

## 2017-08-30 ENCOUNTER — Other Ambulatory Visit: Payer: Self-pay | Admitting: Family Medicine

## 2017-08-30 ENCOUNTER — Encounter: Payer: Self-pay | Admitting: Family Medicine

## 2017-08-30 DIAGNOSIS — F902 Attention-deficit hyperactivity disorder, combined type: Secondary | ICD-10-CM

## 2017-08-30 MED ORDER — LISDEXAMFETAMINE DIMESYLATE 40 MG PO CAPS: 40.0000 mg | ORAL_CAPSULE | ORAL | 0 refills | Status: DC

## 2017-08-30 NOTE — Telephone Encounter (Signed)
Patient last script was increased from 20mg  to 40mg  on 08/02/17. Follow up is on 12/20/17. She wants to know if we can mail script to her?

## 2017-08-30 NOTE — Telephone Encounter (Signed)
Yes to fax Rx for 3 months.

## 2017-08-30 NOTE — Telephone Encounter (Signed)
Script printed patient will come by and pick up. She will call if any questions.

## 2017-10-29 ENCOUNTER — Encounter: Payer: Self-pay | Admitting: Sports Medicine

## 2017-10-29 ENCOUNTER — Ambulatory Visit: Payer: BC Managed Care – PPO | Admitting: Sports Medicine

## 2017-10-29 VITALS — BP 106/78 | HR 91 | Ht 65.0 in | Wt 199.6 lb

## 2017-10-29 DIAGNOSIS — M25552 Pain in left hip: Secondary | ICD-10-CM | POA: Diagnosis not present

## 2017-10-29 DIAGNOSIS — M2550 Pain in unspecified joint: Secondary | ICD-10-CM | POA: Diagnosis not present

## 2017-10-29 DIAGNOSIS — M4722 Other spondylosis with radiculopathy, cervical region: Secondary | ICD-10-CM

## 2017-10-29 MED ORDER — METHYLPREDNISOLONE 4 MG PO TBPK: ORAL_TABLET | ORAL | 0 refills | Status: DC

## 2017-10-29 MED ORDER — GABAPENTIN 300 MG PO CAPS: 300.0000 mg | ORAL_CAPSULE | Freq: Three times a day (TID) | ORAL | 1 refills | Status: DC | PRN

## 2017-10-29 MED ORDER — NITROGLYCERIN 0.2 MG/HR TD PT24: MEDICATED_PATCH | TRANSDERMAL | 1 refills | Status: DC

## 2017-10-29 NOTE — Assessment & Plan Note (Signed)
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  She is status post left hip labral repair and has had ongoing intermittent issues with this.  We will have her begin on nitroglycerin protocol and follow-up in 6 weeks to reevaluate and perform MSK ultrasound at that time.  She has had MRIs before that have shown chronic tendinopathy.  Continue with therapeutic exercise

## 2017-10-29 NOTE — Assessment & Plan Note (Signed)
Fairly classic left-sided C7 radiculitis with slight decrease in her triceps reflex.  Discussed multiple options given only minimal functional impairment we will begin with conservative measures including gabapentin and Medrol Dosepak.  Continue with therapeutic exercises.  If any lack of improvement over the next 2 weeks she will call so we can get her scheduled for MRI of her cervical spine.  Otherwise we will see her back in 6 weeks.

## 2017-10-29 NOTE — Progress Notes (Signed)
Jill Frank, [place] [facility] [place] - 42 y.o. female MRN [id number]  Date of birth: Feb 08, 1976  Visit Date: 10/29/2017  PCP: Briscoe Deutscher, DO   Referred by: Briscoe Deutscher, DO   Scribe for today's visit: Wendy Poet, LAT, ATC     SUBJECTIVE:  Jill Frank is here for Follow-up (L shoulder and wrist pain) .   Jill Frank is a new patient presenting today for evaluation of LT wrist and shoulder pain.  Sx have been worse over the past 3 months.  The pain is described as mild aching and discomfort. She feels like the wrist catches. She has tenderness to palpation on the radial aspect of the wrist.  Pain is rated as 0/10 at rest but 5/10 with use.  Worsened with movement, even worse when using her thumb.  Improves with rest Therapies tried include : she has had steroid injections in the past. She has tried taking Meloxicam with minimal relief  Other associated symptoms include: Pain does radiate a couple of inches into the radial aspect of the forearm.   Shoulder pain has been present x several years but has gotten worse over the past 3 months or so. She has no known injury or trauma to the shoulder. She just moved to the area the end of June 2018. She relates some of her increased pain to this. Pain is mostly on the anterior and posterior aspect of the shoulder. The pain radiates about half way down the triceps. Pain is worse at night, she is unable to lay on her LT side without her shoulder aching and burning. Pain is worse with internal and external rotation and adduction. Pain worsens the higher she raises her arm. She reports crepitus. Pain improves with rest. She has taken Meloxicam with minimal relief. She has not tried using heat or ice on the area because the pain comes and goes.   No recent xray of the shoulder or wrist.    Compared to the last office visit on 06/28/17, her previously  described L shoulder and wrist pain symptoms are worsening w/ increased pain in the L arm w/ N/T also noted in the L UE from the shoulder to the hand. Current symptoms are moderate & are radiating to the L UE. She has been taking Meloxicam daily.  Would also like to talk about her L hip.  Prior hx of L hip ant/post labral repair (2016) - Dr. Dimas Chyle.   ROS Reports night time disturbances. Denies fevers, chills, or night sweats. Denies unexplained weight loss. Reports personal history of cancer.  Melanoma in 1995. Denies changes in bowel or bladder habits. Denies recent unreported falls. Denies new or worsening dyspnea or wheezing. Denies headaches or dizziness.  Reports numbness, tingling or weakness  In the extremities, L UE. Denies dizziness or presyncopal episodes Denies lower extremity edema     HISTORY & PERTINENT PRIOR DATA:  Prior History reviewed and updated per electronic medical record.  Significant history, findings, studies and interim changes include:  reports that  has never smoked. she has never used smokeless tobacco. No results for input(s): HGBA1C, LABURIC, CREATINE in the last 8760 hours. No specialty comments available. Problem  Greater Trochanteric Pain Syndrome of Left Lower Extremity  Osteoarthritis of Spine With Radiculopathy, Cervical Region    OBJECTIVE:  VS:  HT:5\' 5"  (165.1 cm)   WT:199 lb 9.6 oz (90.5 kg)  BMI:33.22    BP:106/78  HR:91bpm  TEMP: ( )  RESP:93 %   PHYSICAL EXAM: Constitutional: WDWN, Non-toxic appearing. Psychiatric: Alert & appropriately interactive.  Not depressed or anxious appearing. Respiratory: No increased work of breathing.  Trachea Midline Eyes: Pupils are equal.  EOM intact without nystagmus.  No scleral icterus  NEUROVASCULAR exam: No clubbing or cyanosis appreciated No significant venous stasis changes Capillary Refill: normal, less than 2 seconds   Patient has pain with Spurling's compression test and  Lhermitte's compression test but no significant radiation to the mid thoracic spine.  Negative Hoffmann's.  Grip strength is intact.  Small amount of decreased DTRs in the left tricep but otherwise normal.  Otherwise strength is well-maintained with only minimal amount of weakness secondary to pain with  Elbow extension.  Pain with brachial plexus squeeze and arm squeeze test.    Left hip is well aligned with good range of motion.  She does have weakness and a TFL predominant hip abduction recruitment pattern.  ASSESSMENT & PLAN:   1. 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 Polyarthralgia   3. Osteoarthritis of spine with radiculopathy, cervical region    ++++++++++++++++++++++++++++++++++++++++++++ Orders & Meds: No orders of the defined types were placed in this encounter.   Meds ordered this encounter  Medications  . methylPREDNISolone (MEDROL DOSEPAK) 4 MG TBPK tablet    Sig: Take by mouth as directed. Take 6 tablets on the first day prescribed then as directed.    Dispense:  21 tablet    Refill:  0  . nitroGLYCERIN (NITRODUR - DOSED IN MG/24 HR) 0.2 mg/hr patch    Sig: Place 1/4 to 1/2 of a patch over affected region. Remove and replace once daily.  Slightly alter skin placement daily    Dispense:  30 patch    Refill:  1    For musculoskeletal purposes.  Okay to cut patch.  . gabapentin (NEURONTIN) 300 MG capsule    Sig: Take 1 capsule (300 mg total) by mouth 3 (three) times daily as needed.    Dispense:  90 capsule    Refill:  1    ++++++++++++++++++++++++++++++++++++++++++++ PLAN:    Osteoarthritis of spine with radiculopathy, cervical region Fairly classic left-sided C7 radiculitis with slight decrease in her triceps reflex.  Discussed multiple options given only minimal functional impairment we will begin with conservative measures including gabapentin and Medrol Dosepak.  Continue with therapeutic exercises.  If any lack of improvement over the next  2 weeks she will call so we can get her scheduled for MRI of her cervical spine.  Otherwise we will see her back in 6 weeks.  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  She is status post left hip labral repair and has had ongoing intermittent issues with this.  We will have her begin on nitroglycerin protocol and follow-up in 6 weeks to reevaluate and perform MSK ultrasound at that time.  She has had MRIs before that have shown chronic tendinopathy.  Continue with therapeutic exercise   Follow-up: Return in about 6 weeks (around 12/10/2017).   Pertinent documentation may be included in additional procedure notes, imaging studies, problem based documentation and patient instructions. Please see these sections of the encounter for additional information regarding this visit. CMA/ATC served as Education administrator during this visit. History, Physical, and Plan performed by medical provider. Documentation and orders reviewed and attested to.      Jill Bond  [facility] Physician

## 2017-10-29 NOTE — Patient Instructions (Signed)

## 2017-12-06 ENCOUNTER — Encounter: Payer: Self-pay | Admitting: Sports Medicine

## 2017-12-06 ENCOUNTER — Ambulatory Visit: Payer: BC Managed Care – PPO | Admitting: Sports Medicine

## 2017-12-06 VITALS — BP 112/78 | HR 97 | Ht 65.0 in | Wt 201.6 lb

## 2017-12-06 DIAGNOSIS — M4722 Other spondylosis with radiculopathy, cervical region: Secondary | ICD-10-CM

## 2017-12-06 DIAGNOSIS — M9901 Segmental and somatic dysfunction of cervical region: Secondary | ICD-10-CM

## 2017-12-06 DIAGNOSIS — M9902 Segmental and somatic dysfunction of thoracic region: Secondary | ICD-10-CM | POA: Diagnosis not present

## 2017-12-06 DIAGNOSIS — M2550 Pain in unspecified joint: Secondary | ICD-10-CM | POA: Diagnosis not present

## 2017-12-06 MED ORDER — GABAPENTIN 300 MG PO CAPS: 300.0000 mg | ORAL_CAPSULE | Freq: Three times a day (TID) | ORAL | 1 refills | Status: DC | PRN

## 2017-12-06 NOTE — Progress Notes (Signed)
  OBJECTIVE:  VS:  HT:5\' 5"  (165.1 cm)   WT:201 lb 9.6 oz (91.4 kg)  BMI:33.55    BP:112/78  HR:97bpm  TEMP: ( )  RESP:97 %   PHYSICAL EXAM: Constitutional: WDWN, Non-toxic appearing. Psychiatric: Alert & appropriately interactive.  Not depressed or anxious appearing. Respiratory: No increased work of breathing.  Trachea Midline Eyes: Pupils are equal.  EOM intact without nystagmus.  No scleral icterus  NEUROVASCULAR exam: No clubbing or cyanosis appreciated No significant venous stasis changes Capillary Refill: normal, less than 2 seconds   Cervical and upper extremity: She has improved cervical range of motion although has persistent straightening of her cervical lordosis.  She has no radicular symptoms with Spurling's compression test or brachial plexus squeeze but does have mild symptoms with arm squeeze test on the left greater than right.  Upper extremity strength is 5 out of 5 in all myotomes and upper extremity sensation is intact to light touch in all dermatomes.  Upper extremity reflexes are symmetric and brisk diffusely.  Otherwise osteopathic exam per procedure note.  Negative VBA testing.  ASSESSMENT & PLAN:   1. Osteoarthritis of spine with radiculopathy, cervical region   2. Polyarthralgia   3. Somatic dysfunction of cervical region   4. Somatic dysfunction of thoracic region     Orders & Meds: No orders of the defined types were placed in this encounter.   Meds ordered this encounter  Medications  . gabapentin (NEURONTIN) 300 MG capsule    Sig: Take 1 capsule (300 mg total) by mouth 3 (three) times daily as needed.    Dispense:  270 capsule    Refill:  1     PLAN: Osteopathic manipulation was performed today based on physical exam findings.  Please see procedure note for further information including Osteopathic Exam findings  Osteoarthritis of spine with radiculopathy, cervical region She has continued to have some mild radicular symptoms but is not having  any significant pain at this time or significant nighttime disturbances due to this.  We discussed multiple options including pursuing further diagnostic evaluation for consideration of epidural steroid injection versus surgical consultation but she would like to hold off on this is her symptoms are not severe and there are no red flag indications necessitating this.  Gentle osteopathic manipulation performed today with mild improvement in her cervical and thoracic mobility with no exacerbation of symptoms.  This was per procedure note.   Follow-up: Return in about 6 weeks (around 01/17/2018).

## 2017-12-06 NOTE — Patient Instructions (Signed)
Please perform the exercise program that we have prepared for you and gone over in detail on a daily basis.  In addition to the handout you were provided you can access your program through: [email or web address]   Your unique program code is: [email or web address]

## 2017-12-06 NOTE — Procedures (Signed)
PROCEDURE NOTE: THERAPEUTIC EXERCISES (97110) 15 minutes spent for Therapeutic exercises as below and as referenced in the AVS. This included exercises focusing on stretching, strengthening, with significant focus on eccentric aspects.  Proper technique shown and discussed handout in great detail with ATC. All questions were discussed and answered.   Long term goals include an improvement in range of motion, strength, endurance as well as avoiding reinjury. Frequency of visits is one time as determined during today's  office visit. Frequency of exercises to be performed is as per handout.  EXERCISES REVIEWED:  thoracic rotation Goodman exercises cervical traction pillow

## 2017-12-06 NOTE — Procedures (Signed)
PROCEDURE NOTE : OSTEOPATHIC MANIPULATION The decision today to treat with Osteopathic Manipulative Therapy (OMT) was based on physical exam findings. Verbal consent was obtained following a discussion with the patient regarding the of risks, benefits and potential side effects, including an acute pain flare,post manipulation soreness and need for repeat treatments. Additionally, we specifically discussed the minimal risk of  injury to neurovascular structures associated with Cervical manipulation.   NONE  Manipulation was performed as below: Regions Treated: cervial spine, Ribs and thoracic spine Techniques used: HVLA, muscle energy, myofascial release and Articulatory   The patient tolerated the treatment well and reported Improved symptoms following treatment today. Patient was given medications, exercises, stretches and lifestyle modifications per AVS and verbally.     OSTEOPATHIC/STRUCTURAL EXAM FINDINGS:   Head Tilt:  left  SOMATIC DYSFUNCTION         Cervical:  C2, Flexion, Side bend: left and Rotation: left C6, Flexion, Side bend: right and Rotation: right C7, Flexion, Side bend: left and Rotation: left        Thoracic:  T2, Flexion, Side bend: left and Rotation: left t4-t8 - nutral  Side bend: left and Rotation: right

## 2017-12-06 NOTE — Progress Notes (Signed)
[name]. Lavontae Cornia, [facility] - 42 y.o. female MRN [id number]  Date of birth: 1976/09/14  Visit Date: 12/06/2017  PCP: Briscoe Deutscher, DO   Referred by: Briscoe Deutscher, DO   Scribe for today's visit: Wendy Poet, LAT, ATC     SUBJECTIVE:  [name] is here for Follow-up (neck and L arm pain) .   Makinze Desmith is a new patient presenting today for evaluation of LT wrist and shoulder pain.  Sx have been worse over the past 3 months.  The pain is described as mild aching and discomfort. She feels like the wrist catches. She has tenderness to palpation on the radial aspect of the wrist.  Pain is rated as 0/10 at rest but 5/10 with use.  Worsened with movement, even worse when using her thumb.  Improves with rest Therapies tried include : she has had steroid injections in the past. She has tried taking Meloxicam with minimal relief  Other associated symptoms include: Pain does radiate a couple of inches into the radial aspect of the forearm.   Shoulder pain has been present x several years but has gotten worse over the past 3 months or so. She has no known injury or trauma to the shoulder. She just moved to the area the end of June 2018. She relates some of her increased pain to this. Pain is mostly on the anterior and posterior aspect of the shoulder. The pain radiates about half way down the triceps. Pain is worse at night, she is unable to lay on her LT side without her shoulder aching and burning. Pain is worse with internal and external rotation and adduction. Pain worsens the higher she raises her arm. She reports crepitus. Pain improves with rest. She has taken Meloxicam with minimal relief. She has not tried using heat or ice on the area because the pain comes and goes.   No recent xray of the shoulder or wrist.   Notes from [place] on 10/29/17: Compared to the last office visit on 06/28/17, her  previously described L shoulder and wrist pain symptoms are worsening w/ increased pain in the L arm w/ N/T also noted in the L UE from the shoulder to the hand. Current symptoms are moderate & are radiating to the L UE. She has been taking Meloxicam daily.  Would also like to talk about her L hip.  Prior hx of L hip ant/post labral repair (2016) - Dr. Dimas Chyle.   Compared to the last office visit on 10/29/17, her previously described neck and L arm pain symptoms are improving.  She states that the neck pain hasn't really changed but notes that the L arm pain has improved.  She states that she con't to have N/T into her L arm. Current symptoms are moderate & are radiating into the L UE. She has been taking the Gabapentin and feels this helps. She is also taking Meloxicam daily.  Taking Zithromax for a UTI currently (day 3)   ROS Denies night time disturbances. Denies fevers, chills, or night sweats. Denies unexplained weight loss. Reports personal history of cancer.  Melanoma 1995. Denies changes in bowel or bladder habits. Denies recent unreported falls. Denies new or worsening dyspnea or wheezing. Denies headaches or dizziness.  Reports numbness, tingling or weakness  In the extremities - in the L UE. Denies dizziness or presyncopal episodes Denies lower extremity edema    HISTORY &  PERTINENT PRIOR DATA:  Prior History reviewed and updated per electronic medical record.  Significant history, findings, studies and interim changes include:  reports that  has never smoked. she has never used smokeless tobacco. No results for input(s): HGBA1C, LABURIC, CREATINE in the last 8760 hours. No specialty comments available. Problem  Osteoarthritis of Spine With Radiculopathy, Cervical Region      Please see additional documentation for Objective, Assessment and Plan sections. Pertinent additional documentation may be included in corresponding procedure notes, imaging studies, problem  based documentation and patient instructions. Please see these sections of the encounter for additional information regarding this visit.  CMA/ATC served as Education administrator during this visit. History, Physical, and Plan performed by medical provider. Documentation and orders reviewed and attested to.      Gerda Diss, [place] Physician

## 2017-12-07 NOTE — Assessment & Plan Note (Signed)
She has continued to have some mild radicular symptoms but is not having any significant pain at this time or significant nighttime disturbances due to this.  We discussed multiple options including pursuing further diagnostic evaluation for consideration of epidural steroid injection versus surgical consultation but she would like to hold off on this is her symptoms are not severe and there are no red flag indications necessitating this.  Gentle osteopathic manipulation performed today with mild improvement in her cervical and thoracic mobility with no exacerbation of symptoms.  This was per procedure note.

## 2017-12-10 ENCOUNTER — Ambulatory Visit: Payer: Self-pay | Admitting: Sports Medicine

## 2017-12-14 ENCOUNTER — Other Ambulatory Visit: Payer: Self-pay | Admitting: Family Medicine

## 2017-12-14 ENCOUNTER — Other Ambulatory Visit (HOSPITAL_COMMUNITY)
Admission: RE | Admit: 2017-12-14 | Discharge: 2017-12-14 | Disposition: A | Discharge status: Home / Self Care | Payer: BC Managed Care – PPO | Source: Ambulatory Visit | Attending: Family Medicine | Admitting: Family Medicine

## 2017-12-14 ENCOUNTER — Ambulatory Visit: Payer: BC Managed Care – PPO | Admitting: Family Medicine

## 2017-12-14 ENCOUNTER — Encounter: Payer: Self-pay | Admitting: Family Medicine

## 2017-12-14 VITALS — BP 118/80 | HR 87 | Temp 98.5°F | Ht 65.0 in | Wt 207.4 lb

## 2017-12-14 DIAGNOSIS — R5383 Other fatigue: Secondary | ICD-10-CM

## 2017-12-14 DIAGNOSIS — N898 Other specified noninflammatory disorders of vagina: Secondary | ICD-10-CM | POA: Diagnosis not present

## 2017-12-14 DIAGNOSIS — R61 Generalized hyperhidrosis: Secondary | ICD-10-CM

## 2017-12-14 DIAGNOSIS — N760 Acute vaginitis: Secondary | ICD-10-CM | POA: Insufficient documentation

## 2017-12-14 DIAGNOSIS — Z111 Encounter for screening for respiratory tuberculosis: Secondary | ICD-10-CM

## 2017-12-14 DIAGNOSIS — E559 Vitamin D deficiency, unspecified: Secondary | ICD-10-CM | POA: Diagnosis not present

## 2017-12-14 DIAGNOSIS — B9689 Other specified bacterial agents as the cause of diseases classified elsewhere: Secondary | ICD-10-CM | POA: Diagnosis not present

## 2017-12-14 DIAGNOSIS — E669 Obesity, unspecified: Secondary | ICD-10-CM | POA: Diagnosis not present

## 2017-12-14 DIAGNOSIS — J301 Allergic rhinitis due to pollen: Secondary | ICD-10-CM | POA: Diagnosis not present

## 2017-12-14 DIAGNOSIS — Z85820 Personal history of malignant melanoma of skin: Secondary | ICD-10-CM

## 2017-12-14 DIAGNOSIS — F331 Major depressive disorder, recurrent, moderate: Secondary | ICD-10-CM

## 2017-12-14 DIAGNOSIS — R300 Dysuria: Secondary | ICD-10-CM

## 2017-12-14 DIAGNOSIS — Z13220 Encounter for screening for lipoid disorders: Secondary | ICD-10-CM | POA: Diagnosis not present

## 2017-12-14 DIAGNOSIS — I10 Essential (primary) hypertension: Secondary | ICD-10-CM | POA: Diagnosis not present

## 2017-12-14 DIAGNOSIS — Z975 Presence of (intrauterine) contraceptive device: Secondary | ICD-10-CM

## 2017-12-14 LAB — LIPID PANEL
Cholesterol: 207 mg/dL — ABNORMAL HIGH (ref 0–200)
HDL: 95.1 mg/dL (ref >39.00)
LDL Cholesterol: 101 mg/dL — ABNORMAL HIGH (ref 0–99)
NonHDL: 111.98
Total CHOL/HDL Ratio: 2
Triglycerides: 55 mg/dL (ref 0.0–149.0)
VLDL: 11 mg/dL (ref 0.0–40.0)

## 2017-12-14 LAB — COMPREHENSIVE METABOLIC PANEL
ALT: 23 U/L (ref 0–35)
AST: 15 U/L (ref 0–37)
Albumin: 3.9 g/dL (ref 3.5–5.2)
Alkaline Phosphatase: 56 U/L (ref 39–117)
BUN: 24 mg/dL — ABNORMAL HIGH (ref 6–23)
CO2: 26 mEq/L (ref 19–32)
Calcium: 9.3 mg/dL (ref 8.4–10.5)
Chloride: 107 mEq/L (ref 96–112)
Creatinine, Ser: 0.76 mg/dL (ref 0.40–1.20)
GFR: 88.75 mL/min (ref >60.00)
Glucose, Bld: 94 mg/dL (ref 70–99)
Potassium: 4.1 mEq/L (ref 3.5–5.1)
Sodium: 139 mEq/L (ref 135–145)
Total Bilirubin: 0.3 mg/dL (ref 0.2–1.2)
Total Protein: 5.9 g/dL — ABNORMAL LOW (ref 6.0–8.3)

## 2017-12-14 LAB — TSH: TSH: 1.45 u[IU]/mL (ref 0.35–4.50)

## 2017-12-14 LAB — URINALYSIS, ROUTINE W REFLEX MICROSCOPIC
Bilirubin Urine: NEGATIVE
Hgb urine dipstick: NEGATIVE
Ketones, ur: NEGATIVE
Nitrite: POSITIVE — AB
Specific Gravity, Urine: 1.025 (ref 1.000–1.030)
Total Protein, Urine: NEGATIVE
Urine Glucose: NEGATIVE
Urobilinogen, UA: 0.2 (ref 0.0–1.0)
pH: 6.5 (ref 5.0–8.0)

## 2017-12-14 LAB — CBC WITH DIFFERENTIAL/PLATELET
Basophils Absolute: 0 10*3/uL (ref 0.0–0.1)
Basophils Relative: 0.4 % (ref 0.0–3.0)
Eosinophils Absolute: 0.1 10*3/uL (ref 0.0–0.7)
Eosinophils Relative: 1.5 % (ref 0.0–5.0)
HCT: 37.5 % (ref 36.0–46.0)
Hemoglobin: 12.8 g/dL (ref 12.0–15.0)
Lymphocytes Relative: 22.6 % (ref 12.0–46.0)
Lymphs Abs: 1.5 10*3/uL (ref 0.7–4.0)
MCHC: 34.1 g/dL (ref 30.0–36.0)
MCV: 94.6 fl (ref 78.0–100.0)
Monocytes Absolute: 0.5 10*3/uL (ref 0.1–1.0)
Monocytes Relative: 8.2 % (ref 3.0–12.0)
Neutro Abs: 4.4 10*3/uL (ref 1.4–7.7)
Neutrophils Relative %: 67.3 % (ref 43.0–77.0)
Platelets: 227 10*3/uL (ref 150.0–400.0)
RBC: 3.96 Mil/uL (ref 3.87–5.11)
RDW: 13.1 % (ref 11.5–15.5)
WBC: 6.6 10*3/uL (ref 4.0–10.5)

## 2017-12-14 LAB — VITAMIN B12: Vitamin B-12: 361 pg/mL (ref 211–911)

## 2017-12-14 LAB — VITAMIN D 25 HYDROXY (VIT D DEFICIENCY, FRACTURES): VITD: 31.62 ng/mL (ref 30.00–100.00)

## 2017-12-14 MED ORDER — TERAZOSIN HCL 10 MG PO CAPS: 10.0000 mg | ORAL_CAPSULE | Freq: Every day | ORAL | 1 refills | Status: DC

## 2017-12-14 MED ORDER — OLMESARTAN MEDOXOMIL 40 MG PO TABS: 40.0000 mg | ORAL_TABLET | Freq: Every day | ORAL | 1 refills | Status: DC

## 2017-12-14 MED ORDER — FLUOCIN-HYDROQUINONE-TRETINOIN 0.01-4-0.05 % EX CREA: TOPICAL_CREAM | CUTANEOUS | 1 refills | Status: AC

## 2017-12-14 MED ORDER — FLUTICASONE PROPIONATE 50 MCG/ACT NA SUSP: 1.0000 | Freq: Every day | NASAL | 2 refills | Status: AC

## 2017-12-14 MED ORDER — MELOXICAM 15 MG PO TABS: 15.0000 mg | ORAL_TABLET | Freq: Every day | ORAL | 1 refills | Status: DC

## 2017-12-14 MED ORDER — VALACYCLOVIR HCL 500 MG PO TABS: 500.0000 mg | ORAL_TABLET | Freq: Every day | ORAL | 1 refills | Status: DC

## 2017-12-14 MED ORDER — BUPROPION HCL ER (XL) 150 MG PO TB24: 150.0000 mg | ORAL_TABLET | Freq: Three times a day (TID) | ORAL | 1 refills | Status: DC

## 2017-12-14 MED ORDER — MONTELUKAST SODIUM 10 MG PO TABS: 10.0000 mg | ORAL_TABLET | Freq: Every day | ORAL | 1 refills | Status: DC

## 2017-12-14 MED ORDER — DULOXETINE HCL 30 MG PO CPEP: 30.0000 mg | ORAL_CAPSULE | Freq: Three times a day (TID) | ORAL | 1 refills | Status: DC

## 2017-12-14 MED ORDER — AZELASTINE HCL 0.1 % NA SOLN: 1.0000 | Freq: Every day | NASAL | 3 refills | Status: AC

## 2017-12-14 NOTE — Progress Notes (Signed)
Jill Frank is a 42 y.o. female is here for follow up.  History of Present Illness:  Shaune Pascal CMA acting as scribe for Dr. Juleen China.  HPI: Patient comes in today for follow up. She is needing refills on her medications today. These have been sent to the pharmacy. Patient has also been having vaginal discharge. She has a history of bacterial vaginitis. She started taking Flagyl that she had on hand. She would like to be checked for [facility] chlamydia today as well. See Assessment and Plan section for Problem Based Charting of other issues discussed today.  Review of Systems  Constitutional: Negative for chills and fever.  HENT: Negative for congestion and ear pain.   Eyes: Negative for blurred vision and double vision.  Respiratory: Negative for cough and wheezing.   Cardiovascular: Negative for chest pain and palpitations.  Gastrointestinal: Negative for nausea and vomiting.  Genitourinary: Negative for dysuria and urgency.  Musculoskeletal: Negative for neck pain.  Neurological: Negative for dizziness and headaches.  Psychiatric/Behavioral: Negative for depression, substance abuse and suicidal ideas.   There are no preventive care reminders to display for this patient.   Depression screen PHQ 2/9 06/21/2017  Decreased Interest 2  Down, Depressed, Hopeless 1  PHQ - 2 Score 3  Altered sleeping 2  Tired, decreased energy 3  Change in appetite 2  Feeling bad or failure about yourself  1  Trouble concentrating 1  Moving slowly or fidgety/restless 1  Suicidal thoughts 0  PHQ-9 Score 13   PMHx, SurgHx, SocialHx, FamHx, Medications, and Allergies were reviewed in the Visit Navigator and updated as appropriate.   Patient Active Problem List   Diagnosis Date Noted  . Greater trochanteric pain syndrome of left lower extremity 10/29/2017  . Osteoarthritis of spine with radiculopathy, cervical region 10/29/2017  . ADHD 08/02/2017  . OSA (obstructive sleep apnea) 07/21/2017  .  Polyarthralgia 06/28/2017  . Left wrist pain 06/28/2017  . Obesity (BMI 30-39.9) 06/23/2017  . Seasonal allergic rhinitis due to pollen 06/23/2017  . Hyperhidrosis 06/23/2017  . Hypertension   . Depression    Social History   Tobacco Use  . Smoking status: Never Smoker  . Smokeless tobacco: Never Used  Substance Use Topics  . Alcohol use: Yes    Comment: Social --1 per week  . Drug use: No   Current Medications and Allergies:   .  ALPRAZolam (XANAX) 0.5 MG tablet, Take 0.5 mg by mouth as needed for anxiety (For air travel.)., Disp: , Rfl:  .  azelastine (ASTELIN) 0.1 % nasal spray, Place 1 spray into both nostrils daily. Use in each nostril as directed, Disp: 30 mL, Rfl: 3 .  buPROPion (WELLBUTRIN XL) 150 MG 24 hr tablet, Take 1 tablet (150 mg total) by mouth 3 (three) times daily., Disp: 270 tablet, Rfl: 1 .  clonazePAM (KLONOPIN) 0.5 MG tablet, Take 1 tablet (0.5 mg total) by mouth 2 (two) times daily as needed for anxiety., Disp: 180 tablet, Rfl: 0 .  Cyanocobalamin (VITAMIN B 12 PO), Take by mouth., Disp: , Rfl:  .  DULoxetine (CYMBALTA) 30 MG capsule, Take 1 capsule (30 mg total) by mouth 3 (three) times daily., Disp: 270 capsule, Rfl: 1 .  fluconazole (DIFLUCAN) 150 MG tablet, TAKE 1 TABLET BY MOUTH ONCE., Disp: , Rfl: 0 .  Fluocin-Hydroquinone-Tretinoin (TRI-LUMA) 0.01-4-0.05 % CREA, Apply daily as needed., Disp: 90 g, Rfl: 1 .  fluticasone (FLONASE) 50 MCG/ACT nasal spray, Place 1 spray into both nostrils daily., Disp:  16 g, Rfl: 2 .  gabapentin (NEURONTIN) 300 MG capsule, Take 1 capsule (300 mg total) by mouth 3 (three) times daily as needed., Disp: 270 capsule, Rfl: 1 .  levonorgestrel (MIRENA) 20 MCG/24HR IUD, 1 each by Intrauterine route once., Disp: , Rfl:  .  lisdexamfetamine (VYVANSE) 40 MG capsule, Take 1 capsule (40 mg total) by mouth every morning. (Patient taking differently: Take 60 mg by mouth every morning. ), Disp: 30 capsule, Rfl: 0 .  meloxicam (MOBIC) 15 MG  tablet, Take 1 tablet (15 mg total) by mouth daily., Disp: 90 tablet, Rfl: 1 .  metroNIDAZOLE (FLAGYL) 500 MG tablet, Take 1 tablet (500 mg total) by mouth 2 (two) times daily., Disp: 14 tablet, Rfl: 0 .  montelukast (SINGULAIR) 10 MG tablet, Take 1 tablet (10 mg total) by mouth at bedtime., Disp: 90 tablet, Rfl: 1 .  Multiple Vitamins-Minerals (VITAMIN D3 COMPLETE PO), Take by mouth., Disp: , Rfl:  .  olmesartan (BENICAR) 40 MG tablet, Take 1 tablet (40 mg total) by mouth daily., Disp: 90 tablet, Rfl: 1 .  OnabotulinumtoxinA (BOTOX IJ), Inject as directed., Disp: , Rfl:  .  terazosin (HYTRIN) 10 MG capsule, Take 1 capsule (10 mg total) by mouth at bedtime., Disp: 90 capsule, Rfl: 1 .  valACYclovir (VALTREX) 500 MG tablet, Take 1 tablet (500 mg total) by mouth daily., Disp: 90 tablet, Rfl: 1   Allergies  Allergen Reactions  . Doxycycline Hives  . Erythromycin Hives  . Penicillins Hives   Review of Systems   Pertinent items are noted in the HPI. Otherwise, ROS is negative.  Vitals:   Vitals:   12/14/17 1001  BP: 118/80  Pulse: 87  Temp: 98.5 F (36.9 C)  TempSrc: Oral  SpO2: 96%  Weight: 207 lb 6.4 oz (94.1 kg)  Height: 5\' 5"  (1.651 m)     Body mass index is 34.51 kg/m.  Physical Exam:   Physical Exam  Constitutional: She is oriented to person, place, and time. She appears well-developed and well-nourished. No distress.  HENT:  Head: Normocephalic and atraumatic.  Right Ear: External ear normal.  Left Ear: External ear normal.  Nose: Nose normal.  Mouth/Throat: Oropharynx is clear and moist.  Eyes: Pupils are equal, round, and reactive to light. Conjunctivae and EOM are normal.  Neck: Normal range of motion. Neck supple. No thyromegaly present.  Cardiovascular: Normal rate, regular rhythm, normal heart sounds and intact distal pulses.  Pulmonary/Chest: Effort normal and breath sounds normal.  Abdominal: Soft. Bowel sounds are normal.  Musculoskeletal: Normal range of  motion.  Lymphadenopathy:    She has no cervical adenopathy.  Neurological: She is alert and oriented to person, place, and time.  Skin: Skin is warm and dry. Capillary refill takes less than 2 seconds.  Psychiatric: She has a normal mood and affect. Her behavior is normal.  Nursing note and vitals reviewed.   Results for orders placed or performed in visit on 12/14/17  Urine Culture  Result Value Ref Range   MICRO NUMBER: [id number]    SPECIMEN QUALITY: ADEQUATE    Sample Source NOT GIVEN    STATUS: FINAL    ISOLATE 1:      Multiple organisms present, each less than 10,000 CFU/mL. These organisms, commonly found on external and internal genitalia, are considered to be colonizers. No further testing performed.  CBC with Differential/Platelet  Result Value Ref Range   WBC 6.6 4.0 - 10.5 K/uL   RBC 3.96 3.87 - 5.11 Mil/uL  Hemoglobin 12.8 12.0 - 15.0 g/dL   HCT 37.5 36.0 - 46.0 %   MCV 94.6 78.0 - 100.0 fl   MCHC 34.1 30.0 - 36.0 g/dL   RDW 13.1 11.5 - 15.5 %   Platelets 227.0 150.0 - 400.0 K/uL   Neutrophils Relative % 67.3 43.0 - 77.0 %   Lymphocytes Relative 22.6 12.0 - 46.0 %   Monocytes Relative 8.2 3.0 - 12.0 %   Eosinophils Relative 1.5 0.0 - 5.0 %   Basophils Relative 0.4 0.0 - 3.0 %   Neutro Abs 4.4 1.4 - 7.7 K/uL   Lymphs Abs 1.5 0.7 - 4.0 K/uL   Monocytes Absolute 0.5 0.1 - 1.0 K/uL   Eosinophils Absolute 0.1 0.0 - 0.7 K/uL   Basophils Absolute 0.0 0.0 - 0.1 K/uL  Comprehensive metabolic panel  Result Value Ref Range   Sodium 139 135 - 145 mEq/L   Potassium 4.1 3.5 - 5.1 mEq/L   Chloride 107 96 - 112 mEq/L   CO2 26 19 - 32 mEq/L   Glucose, Bld 94 70 - 99 mg/dL   BUN 24 (H) 6 - 23 mg/dL   Creatinine, Ser 0.76 0.40 - 1.20 mg/dL   Total Bilirubin 0.3 0.2 - 1.2 mg/dL   Alkaline Phosphatase 56 39 - 117 U/L   AST 15 0 - 37 U/L   ALT 23 0 - 35 U/L   Total Protein 5.9 (L) 6.0 - 8.3 g/dL   Albumin 3.9 3.5 - 5.2 g/dL   Calcium 9.3 8.4 - 10.5 mg/dL   GFR 88.75 >60.00  mL/min  TSH  Result Value Ref Range   TSH 1.45 0.35 - 4.50 uIU/mL  Lipid panel  Result Value Ref Range   Cholesterol 207 (H) 0 - 200 mg/dL   Triglycerides 55.0 0.0 - 149.0 mg/dL   HDL 95.10 >39.00 mg/dL   VLDL 11.0 0.0 - 40.0 mg/dL   LDL Cholesterol 101 (H) 0 - 99 mg/dL   Total CHOL/HDL Ratio 2    NonHDL 111.98   VITAMIN D 25 Hydroxy (Vit-D Deficiency, Fractures)  Result Value Ref Range   VITD 31.62 30.00 - 100.00 ng/mL  Vitamin B12  Result Value Ref Range   Vitamin B-12 361 211 - 911 pg/mL  Urinalysis, Routine w reflex microscopic  Result Value Ref Range   Color, Urine YELLOW Yellow;Lt. Yellow   APPearance CLEAR Clear   Specific Gravity, Urine 1.025 1.000 - 1.030   pH 6.5 5.0 - 8.0   Total Protein, Urine NEGATIVE Negative   Urine Glucose NEGATIVE Negative   Ketones, ur NEGATIVE Negative   Bilirubin Urine NEGATIVE Negative   Hgb urine dipstick NEGATIVE Negative   Urobilinogen, UA 0.2 0.0 - 1.0   Leukocytes, UA TRACE (A) Negative   Nitrite POSITIVE (A) Negative   WBC, UA 0-2/hpf 0-2/hpf   RBC / HPF 0-2/hpf 0-2/hpf   Squamous Epithelial / LPF Rare(0-4/hpf) Rare(0-4/hpf)   Bacteria, UA Rare(<10/hpf) (A) None  QuantiFERON-TB Gold Plus  Result Value Ref Range   QuantiFERON-TB Gold Plus NEGATIVE NEGATIVE   NIL 0.03 IU/mL   Mitogen-NIL >10.00 IU/mL   TB1-NIL <0.00 IU/mL   TB2-NIL 0.00 IU/mL  Cervicovaginal ancillary only  Result Value Ref Range   Bacterial vaginitis **POSITIVE for Gardnerella vaginalis** (A)    Candida vaginitis Negative for Candida species    Chlamydia Negative    Neisseria gonorrhea Negative    Trichomonas Negative     Assessment and Plan:   1. Essential hypertension Well controlled.  No signs of complications, medication side effects, or red flags.  Continue current regimen.    - olmesartan (BENICAR) 40 MG tablet; Take 1 tablet (40 mg total) by mouth daily.  Dispense: 90 tablet; Refill: 1 - CBC with Differential/Platelet - Comprehensive  metabolic panel - Lipid panel  2. Hyperhidrosis Well controlled.  No signs of complications, medication side effects, or red flags.  Continue current regimen.    - terazosin (HYTRIN) 10 MG capsule; Take 1 capsule (10 mg total) by mouth at bedtime.  Dispense: 90 capsule; Refill: 1  3. Moderate episode of recurrent major depressive disorder ([place]) Cntrolled.  No signs of complications, medication side effects, or red flags.  Continue current regimen.    - buPROPion (WELLBUTRIN XL) 150 MG 24 hr tablet; Take 1 tablet (150 mg total) by mouth 3 (three) times daily.  Dispense: 270 tablet; Refill: 1 - DULoxetine (CYMBALTA) 30 MG capsule; Take 1 capsule (30 mg total) by mouth 3 (three) times daily.  Dispense: 270 capsule; Refill: 1  4. Seasonal allergic rhinitis due to pollen Well controlled.  No signs of complications, medication side effects, or red flags.  Continue current regimen.    - azelastine (ASTELIN) 0.1 % nasal spray; Place 1 spray into both nostrils daily. Use in each nostril as directed  Dispense: 30 mL; Refill: 3 - fluticasone (FLONASE) 50 MCG/ACT nasal spray; Place 1 spray into both nostrils daily.  Dispense: 16 g; Refill: 2 - montelukast (SINGULAIR) 10 MG tablet; Take 1 tablet (10 mg total) by mouth at bedtime.  Dispense: 90 tablet; Refill: 1  5. History of melanoma Will be seeing Dermatology.  6. Vaginal discharge Recent vaginal DC, suspicious for BV, which she has frequently. Already taking Flagyl.  - Cervicovaginal ancillary only  7. Dysuria Recent UTI s/p Cipro. Still with dysuria and frequency.  - Urinalysis, Routine w reflex microscopic - Urine Culture  8. Fatigue, unspecified type Ongoing. Difficult time waking in the morning. Likely relate to depression/anxiety, and lack of motivation but will check labs.  - TSH - Vitamin B12  9. Lipid screening - Lipid panel  10. Screening for tuberculosis - QuantiFERON-TB Gold Plus  11. IUD (intrauterine device) in  place  49. Obesity (BMI 30-39.9) The patient is asked to make an attempt to improve diet and exercise patterns to aid in medical management of this problem.   13. Vitamin D deficiency - VITAMIN D 25 Hydroxy (Vit-D Deficiency, Fractures)   . Reviewed expectations re: course of current medical issues. . Discussed self-management of symptoms. . Outlined signs and symptoms indicating need for more acute intervention. . Patient verbalized understanding and all questions were answered. Marland Kitchen Health Maintenance issues including appropriate healthy diet, exercise, and smoking avoidance were discussed with patient. . See orders for this visit as documented in the electronic medical record. . Patient received an After Visit Summary.  CMA served as Education administrator during this visit. History, Physical, and Plan performed by medical provider. The above documentation has been reviewed and is accurate and complete. Briscoe Deutscher, D.O.  Briscoe Deutscher, DO [place] [place] 12/19/2017  Records requested if needed. Time spent with the patient: 45 minutes, of which >50% was spent in obtaining information about her symptoms, reviewing her previous labs, evaluations, and treatments, counseling her about her condition (please see the discussed topics above), and developing a plan to further investigate it; she had a number of questions which I addressed.

## 2017-12-15 ENCOUNTER — Encounter: Payer: Self-pay | Admitting: Family Medicine

## 2017-12-15 ENCOUNTER — Telehealth: Payer: Self-pay | Admitting: Family Medicine

## 2017-12-15 LAB — URINE CULTURE
MICRO NUMBER:: 90345211
SPECIMEN QUALITY:: ADEQUATE

## 2017-12-15 LAB — CERVICOVAGINAL ANCILLARY ONLY
Bacterial vaginitis: POSITIVE — AB
Candida vaginitis: NEGATIVE
Chlamydia: NEGATIVE
Neisseria Gonorrhea: NEGATIVE
Trichomonas: NEGATIVE

## 2017-12-15 MED ORDER — METRONIDAZOLE 500 MG PO TABS: 500.0000 mg | ORAL_TABLET | Freq: Two times a day (BID) | ORAL | 0 refills | Status: DC

## 2017-12-15 NOTE — Telephone Encounter (Signed)
Left message to return call to our office.  

## 2017-12-15 NOTE — Telephone Encounter (Signed)
Patient requests a call explaining her lab results.

## 2017-12-16 ENCOUNTER — Telehealth: Payer: Self-pay | Admitting: Family Medicine

## 2017-12-16 LAB — QUANTIFERON-TB GOLD PLUS
Mitogen-NIL: >10 IU/mL
NIL: 0.03 IU/mL
QuantiFERON-TB Gold Plus: NEGATIVE
TB1-NIL: <0 IU/mL
TB2-NIL: 0 IU/mL

## 2017-12-16 NOTE — Telephone Encounter (Signed)
Spoke with Cassie prior to routing as high priority. See note.

## 2017-12-16 NOTE — Telephone Encounter (Signed)
Copied from [place]. Topic: Quick Communication - Lab Results >> Dec 16, 2017 12:30 PM Francella Solian, CMA wrote: Called patient to inform them of 12/16/2017 lab results. When patient returns call, triage nurse may disclose results. >> Dec 16, 2017  1:17 PM Synthia Innocent wrote: Returning call

## 2017-12-16 NOTE — Telephone Encounter (Signed)
Has lab results open as well will close today

## 2017-12-16 NOTE — Telephone Encounter (Signed)
Pt. Calling to see if urine culture is back. Told her it is not and she will be notified as soon as Dr. Juleen China gets results and reviews it.

## 2017-12-16 NOTE — Telephone Encounter (Signed)
Left message to call back about lab results  

## 2017-12-16 NOTE — Telephone Encounter (Signed)
Pt. Called to check on Urine culture results.  Advised the urine culture is negative.  Stated she is still having symptoms; c/o urine still discolored, and odorous.  Denied frequency.  C/o pressure over suprapubic area.  Voiced concern that she completed a course of Cipro, but now her symptoms are getting worse.  reported has continued Flagyl.   Voiced frustration that she has talked with a nurse 2 previous times today, and cannot get in touch with Dr. Juleen China.

## 2017-12-17 ENCOUNTER — Other Ambulatory Visit: Payer: Self-pay

## 2017-12-17 ENCOUNTER — Encounter: Payer: Self-pay | Admitting: Family Medicine

## 2017-12-17 ENCOUNTER — Telehealth: Payer: Self-pay | Admitting: Family Medicine

## 2017-12-17 MED ORDER — METRONIDAZOLE 500 MG PO TABS: 500.0000 mg | ORAL_TABLET | Freq: Two times a day (BID) | ORAL | 0 refills | Status: AC

## 2017-12-17 MED ORDER — NITROFURANTOIN MONOHYD MACRO 100 MG PO CAPS: 100.0000 mg | ORAL_CAPSULE | Freq: Two times a day (BID) | ORAL | 0 refills | Status: DC

## 2017-12-17 MED ORDER — NITROFURANTOIN MONOHYD MACRO 100 MG PO CAPS: 100.0000 mg | ORAL_CAPSULE | Freq: Two times a day (BID) | ORAL | 0 refills | Status: AC

## 2017-12-17 MED ORDER — METRONIDAZOLE 500 MG PO TABS: 500.0000 mg | ORAL_TABLET | Freq: Two times a day (BID) | ORAL | 0 refills | Status: DC

## 2017-12-17 NOTE — Telephone Encounter (Signed)
I have checked was sent to wrong pharmacy. I have called in to right one and called other and had orders cancelled.

## 2017-12-17 NOTE — Telephone Encounter (Signed)
Check pharmacy

## 2017-12-17 NOTE — Telephone Encounter (Signed)
Patient has several messages open

## 2017-12-17 NOTE — Telephone Encounter (Signed)
See other note

## 2017-12-17 NOTE — Addendum Note (Signed)
Addended by: Briscoe Deutscher R on: 12/17/2017 11:10 AM   Modules accepted: Orders

## 2017-12-17 NOTE — Telephone Encounter (Signed)
Called patient back she wanted to know following:  1. Should she do a 14 day treatment for BV? If so she will need another script for Flagyl.   2. The culture was neg and patient informed but she wanted to know if she should be treated for one. She states she was on cipro for UTI and had finished just a day or so before she came in. She states she is having urgency and strong odor to her urine.

## 2017-12-17 NOTE — Telephone Encounter (Signed)
Copied from [place]. Topic: Quick Communication - See Telephone Encounter >> Dec 17, 2017  2:31 PM Burnis Medin, NT wrote: CRM for notification. See Telephone encounter for: 12/17/17. Patient called and wanted to see if the nurse Dorian Pod could give her a call regarding her medications.

## 2017-12-17 NOTE — Telephone Encounter (Signed)
See note

## 2017-12-17 NOTE — Telephone Encounter (Signed)
Copied from [place]. Topic: Quick Communication - Lab Results >> Dec 16, 2017 12:30 PM Francella Solian, CMA wrote: Called patient to inform them of 12/16/2017 lab results. When patient returns call, triage nurse may disclose results. >> Dec 16, 2017  1:17 PM Synthia Innocent wrote: Returning call  >> Dec 17, 2017  9:56 AM Cleaster Corin, NT wrote: Pt. Calling to speak with Dr. Juleen China Today about symptoms. Pt. Has already spoken with two of our Triage nurses. Pt. Would like to speak with someone in office. Pt.  cb number [phone]

## 2017-12-19 DIAGNOSIS — Z975 Presence of (intrauterine) contraceptive device: Secondary | ICD-10-CM | POA: Insufficient documentation

## 2017-12-20 ENCOUNTER — Ambulatory Visit: Payer: BC Managed Care – PPO | Admitting: Family Medicine

## 2017-12-20 NOTE — Telephone Encounter (Signed)
Left message to return call to our office.  

## 2018-01-18 ENCOUNTER — Ambulatory Visit: Payer: BC Managed Care – PPO | Admitting: Sports Medicine

## 2018-02-25 ENCOUNTER — Encounter: Payer: Self-pay | Admitting: Sports Medicine

## 2018-02-25 ENCOUNTER — Ambulatory Visit: Payer: BC Managed Care – PPO | Admitting: Sports Medicine

## 2018-02-25 ENCOUNTER — Ambulatory Visit: Payer: BC Managed Care – PPO | Admitting: Family Medicine

## 2018-02-25 ENCOUNTER — Encounter: Payer: Self-pay | Admitting: Family Medicine

## 2018-02-25 VITALS — BP 102/80 | HR 97 | Ht 65.0 in | Wt 199.4 lb

## 2018-02-25 VITALS — BP 102/80 | HR 97 | Ht 65.0 in | Wt 199.0 lb

## 2018-02-25 DIAGNOSIS — M4722 Other spondylosis with radiculopathy, cervical region: Secondary | ICD-10-CM

## 2018-02-25 DIAGNOSIS — M9903 Segmental and somatic dysfunction of lumbar region: Secondary | ICD-10-CM

## 2018-02-25 DIAGNOSIS — Z85820 Personal history of malignant melanoma of skin: Secondary | ICD-10-CM

## 2018-02-25 DIAGNOSIS — M2550 Pain in unspecified joint: Secondary | ICD-10-CM

## 2018-02-25 DIAGNOSIS — R300 Dysuria: Secondary | ICD-10-CM

## 2018-02-25 DIAGNOSIS — M9901 Segmental and somatic dysfunction of cervical region: Secondary | ICD-10-CM

## 2018-02-25 DIAGNOSIS — Z23 Encounter for immunization: Secondary | ICD-10-CM | POA: Diagnosis not present

## 2018-02-25 DIAGNOSIS — M9904 Segmental and somatic dysfunction of sacral region: Secondary | ICD-10-CM

## 2018-02-25 DIAGNOSIS — M9902 Segmental and somatic dysfunction of thoracic region: Secondary | ICD-10-CM

## 2018-02-25 DIAGNOSIS — M9905 Segmental and somatic dysfunction of pelvic region: Secondary | ICD-10-CM

## 2018-02-25 DIAGNOSIS — K649 Unspecified hemorrhoids: Secondary | ICD-10-CM | POA: Diagnosis not present

## 2018-02-25 DIAGNOSIS — M25552 Pain in left hip: Secondary | ICD-10-CM

## 2018-02-25 DIAGNOSIS — K6289 Other specified diseases of anus and rectum: Secondary | ICD-10-CM | POA: Diagnosis not present

## 2018-02-25 DIAGNOSIS — M9908 Segmental and somatic dysfunction of rib cage: Secondary | ICD-10-CM

## 2018-02-25 LAB — POCT URINALYSIS DIPSTICK
Bilirubin, UA: NEGATIVE
Blood, UA: NEGATIVE
Glucose, UA: NEGATIVE
Ketones, UA: NEGATIVE
Leukocytes, UA: NEGATIVE
Nitrite, UA: NEGATIVE
Protein, UA: NEGATIVE
Spec Grav, UA: 1.02 (ref 1.010–1.025)
Urobilinogen, UA: 0.2 E.U./dL
pH, UA: 6.5 (ref 5.0–8.0)

## 2018-02-25 MED ORDER — NITROGLYCERIN 0.2 MG/HR TD PT24: 0.2000 mg | MEDICATED_PATCH | Freq: Every day | TRANSDERMAL | 2 refills | Status: DC

## 2018-02-25 MED ORDER — MELOXICAM 15 MG PO TABS: 15.0000 mg | ORAL_TABLET | Freq: Every day | ORAL | 1 refills | Status: DC

## 2018-02-25 MED ORDER — GABAPENTIN 300 MG PO CAPS: 300.0000 mg | ORAL_CAPSULE | Freq: Three times a day (TID) | ORAL | 1 refills | Status: DC | PRN

## 2018-02-25 NOTE — Progress Notes (Signed)
Jill Frank. Jill Frank, [facility] [place] - 42 y.o. female MRN [id number]  Date of birth: 1975/12/03  Visit Date: 02/25/2018  PCP: Briscoe Deutscher, DO   Referred by: Briscoe Deutscher, DO  Scribe for today's visit: Jill Frank, CMA     SUBJECTIVE:  Jill Frank is here for Follow-up (neck pain)  Jill Frank is a new patient presenting today for evaluation of LT wrist and shoulder pain.  Sx have been worse over the past 3 months. The pain is described as mild aching and discomfort. She feels like the wrist catches. She has tenderness to palpation on the radial aspect of the wrist.  Pain is rated as 0/10 at rest but 5/10 with use. Worsened with movement, even worse when using her thumb.  Improves with rest Therapies tried include : she has had steroid injections in the past. She has tried taking Meloxicam with minimal relief Other associated symptoms include: Pain does radiate a couple of inches into the radial aspect of the forearm.  Shoulder pain has been present x several years but has gotten worse over the past 3 months or so. She has no known injury or trauma to the shoulder. She just moved to the area the end of June 2018. She relates some of her increased pain to this. Pain is mostly on the anterior and posterior aspect of the shoulder. The pain radiates about half way down the triceps. Pain is worse at night, she is unable to lay on her LT side without her shoulder aching and burning. Pain is worse with internal and external rotation and adduction. Pain worsens the higher she raises her arm. She reports crepitus. Pain improves with rest. She has taken Meloxicam with minimal relief. She has not tried using heat or ice on the area because the pain comes and goes.  No recent xray of the shoulder or wrist.   Notes from [place] on 10/29/17: Compared to the last office visit on 06/28/17, her previously described L  shoulder and wrist pain symptoms are worsening w/ increased pain in the L arm w/ N/T also noted in the L UE from the shoulder to the hand. Current symptoms are moderate & are radiating to the L UE. She has been taking Meloxicam daily. Would also like to talk about her L hip.  Prior hx of L hip ant/post labral repair (2016) - Dr. Dimas Chyle.  12/06/2017: Compared to the last office visit on 10/29/17, her previously described neck and L arm pain symptoms are improving.  She states that the neck pain hasn't really changed but notes that the L arm pain has improved.  She states that she con't to have N/T into her L arm. Current symptoms are moderate & are radiating into the L UE. She has been taking the Gabapentin and feels this helps. She is also taking Meloxicam daily.  Taking Zithromax for a UTI currently (day 3)  02/25/2018: Compared to the last office visit, her previously described symptoms are improving. She also has c/o multiple musculoskeletal aches and pains.  Current symptoms are moderate & are radiating to multiple areas including L arm. She has been taking Gabapentin and Meloxicam with some relief. She started following Nitro Protocol about 1 month ago and reports no trouble with HA. She feels that the Nitroglycerin patches are helping.  She c/o L-sided epicondylitis. She is having more trouble with pelvic movement. She has been doing HEP intermittently  but is doing more outdoor yard work than anything else. She has also started Yoga but her elbow pain makes this difficult. She tried usuing cervical traction pillow but found it to be more bothersome than beneficial.   ROS Reports night time disturbances. Denies fevers, chills, or night sweats. Denies unexplained weight loss. Reports personal history of cancer. Denies changes in bowel or bladder habits. Denies recent unreported falls. Denies new or worsening dyspnea or wheezing. Denies headaches or dizziness.  Reports numbness,  tingling or weakness  In the extremities.  Denies dizziness or presyncopal episodes Reports lower extremity edema (after sitting for prolonged periods of time)   HISTORY & PERTINENT PRIOR DATA:  Prior History reviewed and updated per electronic medical record.  Significant/pertinent history, findings, studies include:  reports that she has never smoked. She has never used smokeless tobacco. No results for input(s): HGBA1C, LABURIC, CREATINE in the last 8760 hours. No specialty comments available. No problems updated.  OBJECTIVE:  VS:  HT:5\' 5"  (165.1 cm)   WT:199 lb 6.4 oz (90.4 kg)  BMI:33.18    BP:102/80  HR:97bpm  TEMP: ( )  RESP:97 %   PHYSICAL EXAM: Constitutional: WDWN, Non-toxic appearing. Psychiatric: Alert & appropriately interactive.  Not depressed or anxious appearing. Respiratory: No increased work of breathing.  Trachea Midline Eyes: Pupils are equal.  EOM intact without nystagmus.  No scleral icterus  Vascular Exam: warm to touch no edema  upper and lower extremity neuro exam: unremarkable normal strength normal sensation normal reflexes  MSK Exam: Moderate tightness with flexion extension of the hips.  She has generalized hypomobility of the lumbar and thoracic spine.  TTP over the left greater trochanter greater than right.  Good internal and external rotation of bilateral hips.  Negative straight leg raise bilaterally.   ASSESSMENT & PLAN:   1. Osteoarthritis of spine with radiculopathy, cervical region   2. History of melanoma   3. Polyarthralgia   4. Somatic dysfunction of cervical region   5. Greater trochanteric pain syndrome of left lower extremity   6. Somatic dysfunction of thoracic region   7. Somatic dysfunction of lumbar region   8. Somatic dysfunction of rib cage region   9. Somatic dysfunction of pelvis region   10. Somatic dysfunction of sacral region     PLAN: Osteopathic manipulation was performed today based on physical exam  findings.  Please see procedure note for further information including Osteopathic Exam findings  Discussed the foundation of treatment for this condition is physical therapy and/or daily (5-6 days/week) therapeutic exercises, focusing on core strengthening, coordination, neuromuscular control/reeducation.  Therapeutic exercises prescribed per procedure note.  Refill medications  2-week follow-up for repeat osteopathic manipulation.  Continue with home therapeutic exercises  Follow-up: Return in about 2 weeks (around 03/11/2018).   Continue nitroglycerin protocol for left hip.     Please see additional documentation for Objective, Assessment and Plan sections. Pertinent additional documentation may be included in corresponding procedure notes, imaging studies, problem based documentation and patient instructions. Please see these sections of the encounter for additional information regarding this visit.  CMA/ATC served as Education administrator during this visit. History, Physical, and Plan performed by medical provider. Documentation and orders reviewed and attested to.      Gerda Diss, [place] [facility] Physician

## 2018-02-25 NOTE — Progress Notes (Signed)
PROCEDURE NOTE : OSTEOPATHIC MANIPULATION The decision today to treat with Osteopathic Manipulative Therapy (OMT) was based on physical exam findings. Verbal consent was obtained following a discussion with the patient regarding the of risks, benefits and potential side effects, including an acute pain flare,post manipulation soreness and need for repeat treatments.     Contraindications to OMT reviewed and include: NONE  Manipulation was performed as below: Regions treated: Cervical spine, Ribs, Thoracic spine, Lumbar spine and Pelvis OMT Techniques Used: HVLA, muscle energy and myofascial release  The patient tolerated the treatment well and reported Improved symptoms following treatment today. Patient was given medications, exercises, stretches and lifestyle modifications per AVS and verbally.   OSTEOPATHIC/STRUCTURAL EXAM:   C2 through C4 FRS left C5 FRS right T2 FRS  right T4 - T10 rotated left L3 FRS right Left on left sacral torsion Right anterior innominate Rib 3 posterior on the right

## 2018-02-25 NOTE — Progress Notes (Signed)
Jill Frank is a 42 y.o. female here for an acute visit.  History of Present Illness:   HPI: Patient presents for evaluation of hemorrhoids or hemorrhoidal tissue.  She has a history of the same.  She has noticed increased discomfort with bowel movements and some blood on her stools.  She would like to be referred for removal of the hemorrhoid.  No history of colonoscopy.  No family history of colon cancer.  No night sweats or weight loss.  No dark tarry stools.  PMHx, SurgHx, SocialHx, Medications, and Allergies were reviewed in the Visit Navigator and updated as appropriate.  Current Medications:   Current Outpatient Medications:  .  ALPRAZolam (XANAX) 0.5 MG tablet, Take 0.5 mg by mouth as needed for anxiety (For air travel.)., Disp: , Rfl:  .  azelastine (ASTELIN) 0.1 % nasal spray, Place 1 spray into both nostrils daily. Use in each nostril as directed, Disp: 30 mL, Rfl: 3 .  buPROPion (WELLBUTRIN XL) 150 MG 24 hr tablet, Take 1 tablet (150 mg total) by mouth 3 (three) times daily., Disp: 270 tablet, Rfl: 1 .  Cetirizine HCl (ZYRTEC ALLERGY PO), Take by mouth., Disp: , Rfl:  .  clonazePAM (KLONOPIN) 0.5 MG tablet, Take 1 tablet (0.5 mg total) by mouth 2 (two) times daily as needed for anxiety., Disp: 180 tablet, Rfl: 0 .  Cyanocobalamin (VITAMIN B 12 PO), Take by mouth daily. , Disp: , Rfl:  .  DULoxetine (CYMBALTA) 30 MG capsule, Take 1 capsule (30 mg total) by mouth 3 (three) times daily., Disp: 270 capsule, Rfl: 1 .  fluconazole (DIFLUCAN) 150 MG tablet, TAKE 1 TABLET BY MOUTH ONCE., Disp: , Rfl: 0 .  Fluocin-Hydroquinone-Tretinoin (TRI-LUMA) 0.01-4-0.05 % CREA, Apply daily as needed., Disp: 90 g, Rfl: 1 .  fluticasone (FLONASE) 50 MCG/ACT nasal spray, Place 1 spray into both nostrils daily., Disp: 16 g, Rfl: 2 .  gabapentin (NEURONTIN) 300 MG capsule, Take 1 capsule (300 mg total) by mouth 3 (three) times daily as needed., Disp: 270 capsule, Rfl: 1 .  levonorgestrel (MIRENA) 20  MCG/24HR IUD, 1 each by Intrauterine route once., Disp: , Rfl:  .  lisdexamfetamine (VYVANSE) 40 MG capsule, Take 1 capsule (40 mg total) by mouth every morning. (Patient taking differently: Take 70 mg by mouth every morning. ), Disp: 30 capsule, Rfl: 0 .  meloxicam (MOBIC) 15 MG tablet, Take 1 tablet (15 mg total) by mouth daily., Disp: 90 tablet, Rfl: 1 .  montelukast (SINGULAIR) 10 MG tablet, Take 1 tablet (10 mg total) by mouth at bedtime., Disp: 90 tablet, Rfl: 1 .  Multiple Vitamins-Minerals (VITAMIN D3 COMPLETE PO), Take by mouth., Disp: , Rfl:  .  nitroGLYCERIN (NITRODUR - DOSED IN MG/24 HR) 0.2 mg/hr patch, Place 1 patch (0.2 mg total) onto the skin daily. Place 1/4 to 1/2 of a patch over affected region. Remove and replace once daily.  Slightly alter skin placement daily, Disp: 30 patch, Rfl: 2 .  olmesartan (BENICAR) 40 MG tablet, Take 1 tablet (40 mg total) by mouth daily., Disp: 90 tablet, Rfl: 1 .  OnabotulinumtoxinA (BOTOX IJ), Inject as directed., Disp: , Rfl:  .  Pseudoephedrine HCl (SUDAFED 24 HOUR PO), Take by mouth., Disp: , Rfl:  .  terazosin (HYTRIN) 10 MG capsule, Take 1 capsule (10 mg total) by mouth at bedtime., Disp: 90 capsule, Rfl: 1 .  valACYclovir (VALTREX) 500 MG tablet, Take 1 tablet (500 mg total) by mouth daily., Disp: 90 tablet, Rfl: 1  Allergies  Allergen Reactions  . Doxycycline Hives  . Erythromycin Hives  . Penicillins Hives   Review of Systems:   Pertinent items are noted in the HPI. Otherwise, ROS is negative.  Vitals:   Vitals:   02/25/18 1146  BP: 102/80  Pulse: 97  SpO2: 97%  Weight: 199 lb (90.3 kg)  Height: 5\' 5"  (1.651 m)     Body mass index is 33.12 kg/m.  Physical Exam:   Physical Exam  Constitutional: She appears well-nourished.  HENT:  Head: Normocephalic and atraumatic.  Eyes: Pupils are equal, round, and reactive to light. EOM are normal.  Neck: Normal range of motion. Neck supple.  Cardiovascular: Normal rate, regular  rhythm, normal heart sounds and intact distal pulses.  Pulmonary/Chest: Effort normal.  Abdominal: Soft.  Genitourinary: Rectum normal.  Skin: Skin is warm.  No current hemorrhoid.  She does have some very mild hemorrhoidal skin tags.  She has one skin tag that is tender to palpation.  Psychiatric: She has a normal mood and affect. Her behavior is normal.  Nursing note and vitals reviewed.   Results for orders placed or performed in visit on 02/25/18  POCT urinalysis dipstick  Result Value Ref Range   Color, UA Yellow    Clarity, UA Clear    Glucose, UA Negative Negative   Bilirubin, UA Negative    Ketones, UA Negative    Spec Grav, UA 1.020 1.010 - 1.025   Blood, UA Negative    pH, UA 6.5 5.0 - 8.0   Protein, UA Negative Negative   Urobilinogen, UA 0.2 0.2 or 1.0 E.U./dL   Nitrite, UA Negative    Leukocytes, UA Negative Negative   Appearance     Odor      Assessment and Plan:   Diagnoses and all orders for this visit:  Dysuria Comments: Negative Udip. Will wait for culture.  Orders: -     POCT urinalysis dipstick  Hemorrhoids, unspecified hemorrhoid type -     Ambulatory referral to General Surgery  Rectal pain Comments: With possible skin tag. Will refer to surgery to see if intervention would make better. Orders: -     Ambulatory referral to General Surgery  Need for HPV vaccination -     HPV vaccine quadravalent 3 dose IM  Other orders -     Cancel: HPV vaccine quadravalent 3 dose IM    . Reviewed expectations re: course of current medical issues. . Discussed self-management of symptoms. . Outlined signs and symptoms indicating need for more acute intervention. . Patient verbalized understanding and all questions were answered. Marland Kitchen Health Maintenance issues including appropriate healthy diet, exercise, and smoking avoidance were discussed with patient. . See orders for this visit as documented in the electronic medical record. . Patient received an  After Visit Summary.   Briscoe Deutscher, DO [place] 02/27/2018

## 2018-02-25 NOTE — Patient Instructions (Signed)
Please perform the exercise program that we have prepared for you and gone over in detail on a daily basis.  In addition to the handout you were provided you can access your program through: [email or web address]   Your unique program code is: [email or web address]

## 2018-02-27 ENCOUNTER — Encounter: Payer: Self-pay | Admitting: Family Medicine

## 2018-02-28 ENCOUNTER — Encounter: Payer: Self-pay | Admitting: Family Medicine

## 2018-03-01 ENCOUNTER — Other Ambulatory Visit: Payer: Self-pay

## 2018-03-01 DIAGNOSIS — Z23 Encounter for immunization: Secondary | ICD-10-CM

## 2018-03-01 MED ORDER — HPV 9-VALENT RECOMB VACCINE IM SUSP: 1.0000 | INTRAMUSCULAR | 2 refills | Status: DC

## 2018-03-03 ENCOUNTER — Telehealth: Payer: Self-pay | Admitting: Family Medicine

## 2018-03-03 NOTE — Telephone Encounter (Signed)
Copied from [place] [id number]. Topic: Quick Communication - See Telephone Encounter >> Mar 03, 2018 10:46 AM Synthia Innocent wrote: CRM for notification. See Telephone encounter for: 03/03/18.olmesartan (BENICAR) 40 MG tablet is on back order, requesting losartan ref # [id number]

## 2018-03-03 NOTE — Telephone Encounter (Signed)
Ok to change

## 2018-03-03 NOTE — Telephone Encounter (Signed)
See note

## 2018-03-04 NOTE — Telephone Encounter (Signed)
Okay to call in Losartan 50 mg po daily for change. May need to increase to BID dosing if not controlling BP. Patient can monitor and adjust - is NP.

## 2018-03-08 ENCOUNTER — Encounter: Payer: Self-pay | Admitting: Family Medicine

## 2018-03-08 ENCOUNTER — Other Ambulatory Visit: Payer: Self-pay

## 2018-03-08 DIAGNOSIS — I10 Essential (primary) hypertension: Secondary | ICD-10-CM

## 2018-03-08 MED ORDER — OLMESARTAN MEDOXOMIL 40 MG PO TABS: 40.0000 mg | ORAL_TABLET | Freq: Every day | ORAL | 1 refills | Status: DC

## 2018-03-08 MED ORDER — LOSARTAN POTASSIUM 50 MG PO TABS: 50.0000 mg | ORAL_TABLET | Freq: Every day | ORAL | 3 refills | Status: DC

## 2018-03-08 NOTE — Telephone Encounter (Signed)
Called in change but patient did not want to have done she would like original called into local pharmacy.

## 2018-03-08 NOTE — Telephone Encounter (Signed)
I have called mail order and cancelled new script and called in old one to local per patient. She will call if any problems getting filled.

## 2018-03-10 ENCOUNTER — Telehealth: Payer: Self-pay | Admitting: Family Medicine

## 2018-03-10 ENCOUNTER — Other Ambulatory Visit: Payer: Self-pay | Admitting: Surgery

## 2018-03-10 NOTE — Telephone Encounter (Signed)
See note

## 2018-03-10 NOTE — Telephone Encounter (Signed)
See other message all has been addressed.

## 2018-03-10 NOTE — Telephone Encounter (Signed)
Copied from [place] [id number]. Topic: Quick Communication - See Telephone Encounter >> Mar 10, 2018 12:47 PM Cleaster Corin, NT wrote: CRM for notification. See Telephone encounter for: 03/10/18.  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

## 2018-03-10 NOTE — Telephone Encounter (Signed)
Please call back [facility] to clarify scripts. They are not sure if to fill Olmesartan or losartan. BJY#[id number]

## 2018-03-10 NOTE — Telephone Encounter (Signed)
Called Pharmacy d/c both orders.

## 2018-03-11 ENCOUNTER — Ambulatory Visit: Payer: BC Managed Care – PPO | Admitting: Sports Medicine

## 2018-03-11 ENCOUNTER — Encounter: Payer: Self-pay | Admitting: Sports Medicine

## 2018-03-11 VITALS — BP 100/68 | HR 83 | Ht 65.0 in | Wt 195.8 lb

## 2018-03-11 DIAGNOSIS — M9901 Segmental and somatic dysfunction of cervical region: Secondary | ICD-10-CM

## 2018-03-11 DIAGNOSIS — M542 Cervicalgia: Secondary | ICD-10-CM | POA: Diagnosis not present

## 2018-03-11 DIAGNOSIS — M9903 Segmental and somatic dysfunction of lumbar region: Secondary | ICD-10-CM | POA: Diagnosis not present

## 2018-03-11 DIAGNOSIS — M9905 Segmental and somatic dysfunction of pelvic region: Secondary | ICD-10-CM

## 2018-03-11 DIAGNOSIS — M9902 Segmental and somatic dysfunction of thoracic region: Secondary | ICD-10-CM

## 2018-03-11 DIAGNOSIS — M9904 Segmental and somatic dysfunction of sacral region: Secondary | ICD-10-CM

## 2018-03-11 NOTE — Progress Notes (Signed)
Jill Frank. Jill Frank, Jill Frank at [place] - 42 y.o. female MRN [id number]  Date of birth: 09-Jan-1976  Visit Date: 03/11/2018  PCP: Briscoe Deutscher, DO   Referred by: Briscoe Deutscher, DO  Scribe(s) for today's visit: Josepha Pigg, CMA  SUBJECTIVE:  Jill Frank is here for No chief complaint on file.   Jill Frank is a new patient presenting today for evaluation of LT wrist and shoulder pain.  Sx have been worse over the past 3 months. The pain is described as mild aching and discomfort. She feels like the wrist catches. She has tenderness to palpation on the radial aspect of the wrist.  Pain is rated as 0/10 at rest but 5/10 with use. Worsened with movement, even worse when using her thumb.  Improves with rest Therapies tried include : she has had steroid injections in the past. She has tried taking Meloxicam with minimal relief Other associated symptoms include: Pain does radiate a couple of inches into the radial aspect of the forearm.  Shoulder pain has been present x several years but has gotten worse over the past 3 months or so. She has no known injury or trauma to the shoulder. She just moved to the area the end of June 2018. She relates some of her increased pain to this. Pain is mostly on the anterior and posterior aspect of the shoulder. The pain radiates about half way down the triceps. Pain is worse at night, she is unable to lay on her LT side without her shoulder aching and burning. Pain is worse with internal and external rotation and adduction. Pain worsens the higher she raises her arm. She reports crepitus. Pain improves with rest. She has taken Meloxicam with minimal relief. She has not tried using heat or ice on the area because the pain comes and goes.  No recent xray of the shoulder or wrist.   Notes from [place] on 10/29/17: Compared to the last office visit on 06/28/17, her previously  described L shoulder and wrist pain symptoms are worsening w/ increased pain in the L arm w/ N/T also noted in the L UE from the shoulder to the hand. Current symptoms are moderate & are radiating to the L UE. She has been taking Meloxicam daily. Would also like to talk about her L hip.  Prior hx of L hip ant/post labral repair (2016) - Dr. Dimas Chyle.  12/06/2017: Compared to the last office visit on 10/29/17, her previously described neck and L arm pain symptoms are improving.  She states that the neck pain hasn't really changed but notes that the L arm pain has improved.  She states that she con't to have N/T into her L arm. Current symptoms are moderate & are radiating into the L UE. She has been taking the Gabapentin and feels this helps. She is also taking Meloxicam daily.  Taking Zithromax for a UTI currently (day 3)  02/25/2018: Compared to the last office visit, her previously described symptoms are improving. She also has c/o multiple musculoskeletal aches and pains.  Current symptoms are moderate & are radiating to multiple areas including L arm. She has been taking Gabapentin and Meloxicam with some relief. She started following Nitro Protocol about 1 month ago and reports no trouble with HA. She feels that the Nitroglycerin patches are helping.  She c/o L-sided epicondylitis. She is having more trouble with pelvic movement. She has been doing HEP intermittently  but is doing more outdoor yard work than anything else. She has also started Yoga but her elbow pain makes this difficult. She tried usuing cervical traction pillow but found it to be more bothersome than beneficial.   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  She states that she is feeling more muscle pain today.  She notes that she feels the cervical OMT improves her symptoms for a few days and finds this treatment  beneficial. Current symptoms are moderate & are radiating to L arm. She has been taking Gabapentin and Meloxicam with some relief.   She has been following Nitro Protocol with L hip pain and feels this has been beneficial.    REVIEW OF SYSTEMS: Reports night time disturbances. Denies fevers, chills, or night sweats. Denies unexplained weight loss. Reports personal history of cancer. Denies changes in bowel or bladder habits. Denies recent unreported falls. Denies new or worsening dyspnea or wheezing. Denies headaches or dizziness.  Reports numbness, tingling or weakness  In the extremities - transient in the L UE. Denies dizziness or presyncopal episodes Denies lower extremity edema    HISTORY & PERTINENT PRIOR DATA:  Prior History reviewed and updated per electronic medical record.  Significant/pertinent history, findings, studies include:  reports that she has never smoked. She has never used smokeless tobacco. No results for input(s): HGBA1C, LABURIC, CREATINE in the last 8760 hours. No specialty comments available. No problems updated.  OBJECTIVE:  VS:  HT:5\' 5"  (165.1 cm)   WT:195 lb 12.8 oz (88.8 kg)  BMI:32.58    BP:100/68  HR:83bpm  TEMP: ( )  RESP:97 %   PHYSICAL EXAM: Constitutional: WDWN, Non-toxic appearing. Psychiatric: Alert & appropriately interactive.  Not depressed or anxious appearing. Respiratory: No increased work of breathing.  Trachea Midline Eyes: Pupils are equal.  EOM intact without nystagmus.  No scleral icterus  Vascular Exam: warm to touch no edema  upper and lower extremity neuro exam: unremarkable normal strength normal sensation  MSK Exam: Neck with somewhat limited range of motion from a sidebending and rotation standpoint per osteopathic exam.  Thoracic spine is fairly rigid with a moderate kyphosis.  She has good internal/external rotation bilateral hips.  Negative straight leg raise bilaterally.  Negative upper motor neuron  signs and overall good strength with hip abduction   ASSESSMENT & PLAN:   1. Neck pain   2. Somatic dysfunction of cervical region   3. Somatic dysfunction of thoracic region   4. Somatic dysfunction of lumbar region   5. Somatic dysfunction of pelvis region   6. Somatic dysfunction of sacral region     PLAN: Osteopathic manipulation was performed today based on physical exam findings.  Please see procedure note for further information including Osteopathic Exam findings  Given persistent ongoing symptoms will plan to have her work with physical therapy to work on core conditioning and strengthening to work on improving her anterior chain dominance and posterior chain recruitment patterns.  Follow-up with any lack of improvement or focal issues sooner otherwise return to care in 1 month.  Follow-up: Return in about 1 month (around 04/08/2018).      Please see additional documentation for Objective, Assessment and Plan sections. Pertinent additional documentation may be included in corresponding procedure notes, imaging studies, problem based documentation and patient instructions. Please see these sections of the encounter for additional information regarding this visit.  CMA/ATC served as Education administrator during this visit.  History, Physical, and Plan performed by medical provider. Documentation and orders reviewed and attested to.      Gerda Diss, [place] [facility] Physician

## 2018-03-11 NOTE — Progress Notes (Signed)
PROCEDURE NOTE : OSTEOPATHIC MANIPULATION The decision today to treat with Osteopathic Manipulative Therapy (OMT) was based on physical exam findings. Verbal consent was obtained following a discussion with the patient regarding the of risks, benefits and potential side effects, including an acute pain flare,post manipulation soreness and need for repeat treatments.     Contraindications to OMT reviewed and include: NONE  Manipulation was performed as below: Regions treated: Cervical spine, Ribs, Thoracic spine, Lumbar spine and Pelvis OMT Techniques Used: HVLA, muscle energy and myofascial release  The patient tolerated the treatment well and reported Improved symptoms following treatment today. Patient was given medications, exercises, stretches and lifestyle modifications per AVS and verbally.   OSTEOPATHIC/STRUCTURAL EXAM:   C2 through C4 FRS left C5 FRS right T2 extended side bent right T4 through T6 rotated left L3 FRS right Posterior rib 6 on the left Right anterior innominate

## 2018-03-14 ENCOUNTER — Encounter: Payer: Self-pay | Admitting: Family Medicine

## 2018-03-14 ENCOUNTER — Encounter: Payer: Self-pay | Admitting: Sports Medicine

## 2018-03-15 ENCOUNTER — Other Ambulatory Visit: Payer: Self-pay | Admitting: Physical Therapy

## 2018-03-15 ENCOUNTER — Other Ambulatory Visit: Payer: Self-pay

## 2018-03-15 MED ORDER — NITROGLYCERIN 0.2 MG/HR TD PT24: 0.2000 mg | MEDICATED_PATCH | Freq: Every day | TRANSDERMAL | 0 refills | Status: DC

## 2018-03-15 MED ORDER — NITROGLYCERIN 0.2 MG/HR TD PT24: MEDICATED_PATCH | TRANSDERMAL | 0 refills | Status: DC

## 2018-03-16 ENCOUNTER — Encounter: Payer: Self-pay | Admitting: Physical Therapy

## 2018-03-16 ENCOUNTER — Ambulatory Visit: Payer: BC Managed Care – PPO | Admitting: Physical Therapy

## 2018-03-16 ENCOUNTER — Ambulatory Visit: Payer: BC Managed Care – PPO | Admitting: Family Medicine

## 2018-03-16 DIAGNOSIS — M542 Cervicalgia: Secondary | ICD-10-CM

## 2018-03-16 NOTE — Patient Instructions (Signed)
Access Code: [email or web address]  URL: [email or web address]  Date: 03/16/2018  Prepared by: Lyndee Hensen   Exercises  Seated Cervical Retraction - 10 reps - 2 sets - 4x daily  Supine Chest Stretch on Foam Roll - 10 reps - 2 sets - 2x daily  Supine Shoulder Flexion with Dowel - 10 reps - 2 sets - 2x daily  Seated Scapular Retraction - 10 reps - 2 sets - 2x daily  Scapular Retraction with Resistance - 10 reps - 2 sets - 2x daily  Seated Cervical Sidebending Stretch - 3 reps - 30 hold - 2x daily  Seated Levator Scapulae Stretch - 3 reps - 30 hold - 2x daily

## 2018-03-17 ENCOUNTER — Telehealth: Payer: Self-pay | Admitting: Family Medicine

## 2018-03-17 NOTE — Therapy (Addendum)
[facility] [address] [place], [place], [address] Phone: [phone]   Fax:  [phone]  Physical Therapy Evaluation  Patient Details  Name: Jill Frank MRN: [id number] Date of Birth: 1976/04/03 Referring Provider: Teresa Coombs   Encounter Date: 03/16/2018  PT End of Session - 03/17/18 1014    Visit Number  1    Number of Visits  12    Date for PT Re-Evaluation  04/27/18    Authorization Type  BCBS    PT Start Time  1515    PT Stop Time  1558    PT Time Calculation (min)  43 min    Activity Tolerance  Patient tolerated treatment well    Behavior During Therapy  [facility] for tasks assessed/performed       Past Medical History:  Diagnosis Date  . Abnormal Pap smear of cervix 05/2013   --had colposcopy for LGSIL but no treatment to cervix  . ADHD   . Anxiety   . Arthritis   . Cancer ([place]) 1998   back  . Depression   . Fibroadenoma    Rt.Breast  . Hyperhidrosis 06/23/2017  . Hyperhidrosis   . Hypertension   . Melasma   . Obstructive sleep apnea   . Piriformis syndrome   . PTSD (post-traumatic stress disorder)   . Sacroiliitis ([place])   . Seasonal allergic rhinitis due to pollen 06/23/2017  . Vitamin D deficiency     Past Surgical History:  Procedure Laterality Date  . BREAST BIOPSY Right    benign  . BREAST SURGERY     breast biopsy  . COLPOSCOPY  05/2013   LGSIL--no treatment  . excision of melanoma  1998   --back  . HIP ARTHROSCOPY W/ LABRAL REPAIR Left 11/2013  . KNEE ARTHROSCOPY  02/2013    There were no vitals filed for this visit.   Subjective Assessment - 03/16/18 1515    Subjective  Pt states ongoing pain in neck and L UE. She has not had any treatment in about year. She states bothersome pain mostly at night, or with increased activity. Pt works full time, as Pharmacist, hospital in [employer], quantity at [employer]. She states main complaint of "tightness in neck and upper back., but cant pinpoint a spot that hurts most.      Limitations  Lifting;House hold activities    Patient Stated Goals  Decreased pain,     Currently in Pain?  Yes    Pain Score  3     Pain Location  Neck    Pain Orientation  Left    Pain Descriptors / Indicators  Aching;Tightness    Pain Type  Chronic pain    Pain Radiating Towards  Pt unable to determine specific area of neck that is most painful.     Pain Onset  More than a month ago    Pain Frequency  Intermittent    Aggravating Factors   Increased use of L UE, sleeping.     Pain Relieving Factors  none         [facility] PT Assessment - 03/17/18 0001      Assessment   Medical Diagnosis  Neck pain, L radiculopathy    Referring Provider  Teresa Coombs    Hand Dominance  Right    Next MD Visit  3-4 weeks    Prior Therapy  1 year ago      Precautions   Precautions  None      Balance Screen  Has the patient fallen in the past 6 months  No      Prior Function   Level of Independence  Independent      Cognition   Overall Cognitive Status  Within Functional Limits for tasks assessed      ROM / Strength   AROM / PROM / Strength  AROM;Strength      AROM   Overall AROM Comments  Mild limitation for shoulder flexion; mild/moderate limitations for Bil cervical rotation, and flex/ext;       Strength   Overall Strength Comments  L shoulder: 4/5; scapular muscles: 4-/5      Palpation   Palpation comment  Tightness and tenderness in Bil UT, cervical paraspinals, and sub occipitals; Mild tenderness at ECRB at lateral elbow                Objective measurements completed on examination: See above findings.      [facility] Adult PT Treatment/Exercise - 03/17/18 0001      Exercises   Exercises  Shoulder;Neck      Neck Exercises: Seated   Neck Retraction  20 reps      Shoulder Exercises: Supine   Flexion  20 reps    Flexion Limitations  cane      Shoulder Exercises: Seated   Retraction  20 reps      Neck Exercises: Stretches   Upper Trapezius Stretch  3  reps;20 seconds    Levator Stretch  3 reps;20 seconds             PT Education - 03/17/18 1013    Education Details  HEP, PT POC, posture     Person(s) Educated  Patient    Methods  Explanation;Demonstration;Verbal cues;Handout    Comprehension  Verbalized understanding;Need further instruction;Verbal cues required       PT Short Term Goals - 03/17/18 1446      PT SHORT TERM GOAL #1   Title  Pt to report decreased pain in neck region to 3/10     Time  2    Period  Weeks    Status  New    Target Date  03/31/18        PT Long Term Goals - 03/17/18 1446      PT LONG TERM GOAL #1   Title  Pt to demo improved cervical ROM, to be WNL, to improve ability for head turns while driving, and work duties.     Time  6    Period  Weeks    Status  New    Target Date  04/27/18      PT LONG TERM GOAL #2   Title  Pt to report decreased pain in cervical region and symptoms in L UE, to be 0-2/10 with activity     Time  6    Period  Weeks    Status  New    Target Date  04/27/18      PT LONG TERM GOAL #3   Title  Pt to demo improved L shoulder ROM to be WNL to improve ability for reaching and IADLs.     Time  6    Period  Weeks    Status  New    Target Date  04/27/18      PT LONG TERM GOAL #4   Title  Pt to demo improved strength of shoulders and scapular muscles to be at least 4+/5 to improve posture, pain, and ability for work duties.  Time  6    Period  Weeks    Status  New    Target Date  04/27/18             Plan - 03/17/18 1015    Clinical Impression Statement  Pt presents with primary complaint of increased pain in neck and in L UE. She has increased muscle tightness in upper traps, and most cervical musculature. She has mild stiffness in spine, with lack of ROM. She also has mild stiffness in L shoulder, mostly at end ranges of flexion and ER. Pt with increased pain with UE activity, and work duties, and has poor posture with movement. She has decreased  ability for full functional activities, reaching, carrying, lifting, and IADLs. Pt to benefit from skilled PT to improve deficits, and improve functional mobility.     Clinical Presentation  Stable    Clinical Decision Making  Low    Rehab Potential  Good    Clinical Impairments Affecting Rehab Potential  Pt may only be able to attend 1/wk with her work schedule.     PT Frequency  2x / week    PT Duration  6 weeks    PT Treatment/Interventions  ADLs/Self Care Home Management;Cryotherapy;Electrical Stimulation;Iontophoresis '4mg'$ /ml Dexamethasone;Moist Heat;Therapeutic activities;Functional mobility training;Ultrasound;Therapeutic exercise;Neuromuscular re-education;Patient/family education;Dry needling;Passive range of motion;Manual techniques;Taping    Consulted and Agree with Plan of Care  Patient       Patient will benefit from skilled therapeutic intervention in order to improve the following deficits and impairments:  Decreased activity tolerance, Decreased strength, Pain, Impaired UE functional use, Increased muscle spasms, Improper body mechanics, Decreased range of motion, Decreased safety awareness, Impaired flexibility  Visit Diagnosis: Neck pain     Problem List Patient Active Problem List   Diagnosis Date Noted  . IUD (intrauterine device) in place 12/19/2017  . Greater trochanteric pain syndrome of left lower extremity 10/29/2017  . Osteoarthritis of spine with radiculopathy, cervical region 10/29/2017  . ADHD 08/02/2017  . OSA (obstructive sleep apnea) 07/21/2017  . Polyarthralgia 06/28/2017  . Left wrist pain 06/28/2017  . Obesity (BMI 30-39.9) 06/23/2017  . Seasonal allergic rhinitis due to pollen 06/23/2017  . Hyperhidrosis 06/23/2017  . Hypertension   . Depression     Lyndee Hensen, PT, DPT 2:59 PM  03/17/18    [facility] [place], [place], [address] Phone: [phone]   Fax:  [phone]  Name:  Jill Frank MRN: [id number] Date of Birth: 04/22/1976     PHYSICAL THERAPY DISCHARGE SUMMARY  Visits from Start of Care: 1 Pt was seen for evaluation and did not return for further appointments.    Plan: Patient agrees to discharge.  Patient goals were not met. Patient is being discharged due to not returning since the last visit.  ?????      Lyndee Hensen, PT, DPT 2:12 PM  05/11/18

## 2018-03-17 NOTE — Telephone Encounter (Signed)
Copied from [place] [id number]. Topic: General - Other >> Mar 17, 2018 10:41 AM Cecelia Byars, NT wrote: Reason for CRM: [facility] called and said the instructions from the pcp were to put a quarter  to half a patch on the skin ,and this medication should not be cut per the manufacture and they are in order for the pharmacy to  verify please  call [phone] ref# [id number] , the  pharmacy also said they will put the prescription on hold

## 2018-03-17 NOTE — Telephone Encounter (Signed)
See note

## 2018-03-17 NOTE — Telephone Encounter (Signed)
Jill Frank has faxed the Prescription Information Request addressing [facility]'s concerns regarding Ref # [id number].

## 2018-03-17 NOTE — Telephone Encounter (Signed)
OK to cut the nitroglycerin patches for musculoskeletal use per Dr. Paulla Fore

## 2018-03-23 ENCOUNTER — Encounter: Payer: BC Managed Care – PPO | Admitting: Physical Therapy

## 2018-04-10 ENCOUNTER — Encounter: Payer: Self-pay | Admitting: Sports Medicine

## 2018-04-11 ENCOUNTER — Encounter: Payer: Self-pay | Admitting: Sports Medicine

## 2018-04-11 ENCOUNTER — Ambulatory Visit: Payer: BC Managed Care – PPO | Admitting: Sports Medicine

## 2018-04-11 VITALS — BP 114/78 | HR 91 | Ht 65.0 in | Wt 201.2 lb

## 2018-04-11 DIAGNOSIS — M9901 Segmental and somatic dysfunction of cervical region: Secondary | ICD-10-CM

## 2018-04-11 DIAGNOSIS — M25552 Pain in left hip: Secondary | ICD-10-CM

## 2018-04-11 DIAGNOSIS — M4722 Other spondylosis with radiculopathy, cervical region: Secondary | ICD-10-CM

## 2018-04-11 DIAGNOSIS — M9903 Segmental and somatic dysfunction of lumbar region: Secondary | ICD-10-CM

## 2018-04-11 DIAGNOSIS — M9908 Segmental and somatic dysfunction of rib cage: Secondary | ICD-10-CM

## 2018-04-11 DIAGNOSIS — M9905 Segmental and somatic dysfunction of pelvic region: Secondary | ICD-10-CM

## 2018-04-11 DIAGNOSIS — M9904 Segmental and somatic dysfunction of sacral region: Secondary | ICD-10-CM

## 2018-04-11 DIAGNOSIS — M9902 Segmental and somatic dysfunction of thoracic region: Secondary | ICD-10-CM

## 2018-04-11 NOTE — Patient Instructions (Addendum)
Also check out [employer]" which is a program developed by Dr. Minerva Ends.   There are links to a couple of his YouTube Videos below and I would like to see performing one of his videos 5-6 days per week.    A good intro video is: "Independence from Pain 7-minute Video" - [email or web address]   Exercises that focus more on the neck are as below: Dr. Archie Balboa with [facility] teaching neck and shoulder details Part 1 - https://youtu.be/[email or web address] Part 2 Dr. Archie Balboa with [facility] quick routine to practice daily - https://[email or web address].be/[email or web address]  Do not try to attempt the entire video when first beginning.    Try breaking of each exercise that he goes into shorter segments.  Otherwise if they perform an exercise for 45 seconds, start with 15 seconds and rest and then resume when they begin the new activity.  If you work your way up to being able to do these videos without having to stop, I expect you will see significant improvements in your pain.  If you enjoy his videos and would like to find out more you can look on his website: [email or web address]  He has a workout streaming option as well as a DVD set available for purchase.  Amazon has the best price for his DVDs.     Please perform the exercise program that we have prepared for you and gone over in detail on a daily basis.  In addition to the handout you were provided you can access your program through: [email or web address]   Your unique program code is:  [phone]

## 2018-04-11 NOTE — Progress Notes (Signed)
Jill Frank. Purvi Ruehl, [place] [facility] [place] - 42 y.o. female MRN [id number]  Date of birth: 1975-12-25  Visit Date: 04/11/2018  PCP: Briscoe Deutscher, DO   Referred by: Briscoe Deutscher, DO  Scribe(s) for today's visit: Wendy Poet, LAT, ATC  SUBJECTIVE:  [name] is here for Follow-up (Neck  and L hip pain) .    Kayloni Opperman is a new patient presenting today for evaluation of LT wrist and shoulder pain.  Sx have been worse over the past 3 months. The pain is described as mild aching and discomfort. She feels like the wrist catches. She has tenderness to palpation on the radial aspect of the wrist.  Pain is rated as 0/10 at rest but 5/10 with use. Worsened with movement, even worse when using her thumb.  Improves with rest Therapies tried include : she has had steroid injections in the past. She has tried taking Meloxicam with minimal relief Other associated symptoms include: Pain does radiate a couple of inches into the radial aspect of the forearm.  Shoulder pain has been present x several years but has gotten worse over the past 3 months or so. She has no known injury or trauma to the shoulder. She just moved to the area the end of June 2018. She relates some of her increased pain to this. Pain is mostly on the anterior and posterior aspect of the shoulder. The pain radiates about half way down the triceps. Pain is worse at night, she is unable to lay on her LT side without her shoulder aching and burning. Pain is worse with internal and external rotation and adduction. Pain worsens the higher she raises her arm. She reports crepitus. Pain improves with rest. She has taken Meloxicam with minimal relief. She has not tried using heat or ice on the area because the pain comes and goes.  No recent xray of the shoulder or wrist.   Notes from [place] on 10/29/17: Compared to the last office visit on 06/28/17, her  previously described L shoulder and wrist pain symptoms are worsening w/ increased pain in the L arm w/ N/T also noted in the L UE from the shoulder to the hand. Current symptoms are moderate & are radiating to the L UE. She has been taking Meloxicam daily. Would also like to talk about her L hip.  Prior hx of L hip ant/post labral repair (2016) - Dr. Dimas Chyle.  12/06/2017: Compared to the last office visit on 10/29/17, her previously described neck and L arm pain symptoms are improving.  She states that the neck pain hasn't really changed but notes that the L arm pain has improved.  She states that she con't to have N/T into her L arm. Current symptoms are moderate & are radiating into the L UE. She has been taking the Gabapentin and feels this helps. She is also taking Meloxicam daily.  Taking Zithromax for a UTI currently (day 3)  02/25/2018: Compared to the last office visit, her previously described symptoms are improving. She also has c/o multiple musculoskeletal aches and pains.  Current symptoms are moderate & are radiating to multiple areas including L arm. She has been taking Gabapentin and Meloxicam with some relief. She started following Nitro Protocol about 1 month ago and reports no trouble with HA. She feels that the Nitroglycerin patches are helping.  She c/o L-sided epicondylitis. She is having more trouble with pelvic movement. She  has been doing HEP intermittently but is doing more outdoor yard work than anything else. She has also started Yoga but her elbow pain makes this difficult. She tried usuing cervical traction pillow but found it to be more bothersome than beneficial.   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  She states that she is feeling more muscle pain today.  She notes that she feels the cervical OMT improves her symptoms for a few days and finds this treatment  beneficial. Current symptoms are moderate & are radiating to L arm. She has been taking Gabapentin and Meloxicam with some relief.   She has been following Nitro Protocol with L hip pain and feels this has been beneficial.   04/11/18: Compared to the last office visit on 03/11/2018, her previously described neck and radiating L arm pain symptoms show no change.  She states that she feels like her L shoulder is more problematic at this point and is wondering if there is something separately wrong vs just referred pain. Current symptoms are moderate & are radiating to L arm. She has been taking Gabapentin and Meloxicam.  She has attended 1 PT session and doesn't plan to continue.  She has been receiving OMT treatment w/ Dr. Paulla Fore.  She states that she didn't do well w/ the cervical traction pillow but has purchased a new heating pad and feels this helped.  She has been following the nitro protocol for her L hip and feels this has been beneficial.   REVIEW OF SYSTEMS: Reports night time disturbances. Denies fevers, chills, or night sweats. Denies unexplained weight loss. Reports personal history of cancer. Denies changes in bowel or bladder habits. Denies recent unreported falls. Denies new or worsening dyspnea or wheezing. Denies headaches or dizziness.  Reports numbness, tingling or weakness  In the extremities - minimally in the L hand.  Has improved w/ medicine. Denies dizziness or presyncopal episodes Denies lower extremity edema    HISTORY & PERTINENT PRIOR DATA:  Prior History reviewed and updated per electronic medical record.  Significant/pertinent history, findings, studies include:  reports that she has never smoked. She has never used smokeless tobacco. No results for input(s): HGBA1C, LABURIC, CREATINE in the last 8760 hours. No specialty comments available. No problems updated.  OBJECTIVE:  VS:  HT:5\' 5"  (165.1 cm)   WT:201 lb 3.2 oz (91.3 kg)  BMI:33.48     BP:114/78  HR:91bpm  TEMP: ( )  RESP:95 %   PHYSICAL EXAM: Constitutional: WDWN, Non-toxic appearing. Psychiatric: Alert & appropriately interactive.  Not depressed or anxious appearing. Respiratory: No increased work of breathing.  Trachea Midline Eyes: Pupils are equal.  EOM intact without nystagmus.  No scleral icterus  Vascular Exam: warm to touch no edema  upper and lower extremity neuro exam: unremarkable normal strength normal sensation  MSK Exam: Head neck overall well aligned.  She has a significant anterior chain dominance of the wrist with a protracted shoulder left greater than right.  She does have 10 to 15 degrees of limited overhead flexion and abduction on the left compared to the right.  Small amount of pain and crepitation with axial load and circumduction of the left shoulder that is worse than on the right.  O'Brien's testing is moderately painful.  Speeds testing is less painful.  Empty can test strength is intact although she has a very loud cavitation with this.  Negative Spurling's compression test and  Lhermitte's compression test.  She has poor thoracic mobility.   ASSESSMENT & PLAN:  No diagnosis found.  PLAN: Osteopathic manipulation was performed today based on physical exam findings.  Please see procedure note for further information including Osteopathic Exam findings  Links to [employer] provided today per Patient Instructions.  These exercises were developed by Minerva Ends, DC with a strong emphasis on core neuromuscular reducation and postural realignment through body-weight exercises.  Discussed the foundation of treatment for this condition is physical therapy and/or daily (5-6 days/week) therapeutic exercises, focusing on core strengthening, coordination, neuromuscular control/reeducation.  Therapeutic exercises prescribed per procedure note.  We did discuss that her left shoulder may be related to an underlying intra-articular  pathology such as a labral tear but she is agreeable to holding off to further diagnostic or therapeutic interventions at this time until the symptoms continue to worsen or if any lack of improvement with ongoing therapy.  We will plan to check in with her in 2 weeks with considering repeat osteopathic manipulation and can consider intra-articular injection of the left shoulder at that time.  Follow-up: Return in about 2 weeks (around 04/25/2018).      Please see additional documentation for Objective, Assessment and Plan sections. Pertinent additional documentation may be included in corresponding procedure notes, imaging studies, problem based documentation and patient instructions. Please see these sections of the encounter for additional information regarding this visit.  CMA/ATC served as Education administrator during this visit. History, Physical, and Plan performed by medical provider. Documentation and orders reviewed and attested to.      Gerda Diss, [place] Sports Medicine Physician

## 2018-04-11 NOTE — Progress Notes (Signed)
PROCEDURE NOTE: THERAPEUTIC EXERCISES (97110) 15 minutes spent for Therapeutic exercises as below and as referenced in the AVS.  This included exercises focusing on stretching, strengthening, with significant focus on eccentric aspects.   Proper technique shown and discussed handout in great detail with ATC.  All questions were discussed and answered.   Long term goals include an improvement in range of motion, strength, endurance as well as avoiding reinjury. Frequency of visits is one time as determined during today's  office visit. Frequency of exercises to be performed is as per handout.  EXERCISES REVIEWED:  Jill Frank Exercises   Towel stretching exercises

## 2018-04-11 NOTE — Progress Notes (Signed)
PROCEDURE NOTE : OSTEOPATHIC MANIPULATION The decision today to treat with Osteopathic Manipulative Therapy (OMT) was based on physical exam findings. Verbal consent was obtained following a discussion with the patient regarding the of risks, benefits and potential side effects, including an acute pain flare,post manipulation soreness and need for repeat treatments.     Contraindications to OMT reviewed and include: NONE  Manipulation was performed as below: Regions treated: Cervical spine, Ribs, Thoracic spine, Lumbar spine, Pelvis and Sacrum OMT Techniques Used: HVLA, muscle energy and myofascial release  The patient tolerated the treatment well and reported Improved symptoms following treatment today. Patient was given medications, exercises, stretches and lifestyle modifications per AVS and verbally.   OSTEOPATHIC/STRUCTURAL EXAM:   C2 through C4 FRS left C5 FRS right T2-T6 neutral rotated left, side bent right Posterior rib 6 on the left L3 FRS right Right on right sacral torsion Left anterior innominate

## 2018-04-13 ENCOUNTER — Encounter: Payer: Self-pay | Admitting: Sports Medicine

## 2018-04-21 ENCOUNTER — Encounter: Payer: Self-pay | Admitting: Family Medicine

## 2018-04-22 ENCOUNTER — Ambulatory Visit: Payer: BC Managed Care – PPO | Admitting: Internal Medicine

## 2018-04-22 ENCOUNTER — Encounter: Payer: Self-pay | Admitting: Internal Medicine

## 2018-04-22 DIAGNOSIS — M79675 Pain in left toe(s): Secondary | ICD-10-CM

## 2018-04-22 MED ORDER — SULFAMETHOXAZOLE-TRIMETHOPRIM 800-160 MG PO TABS: 1.0000 | ORAL_TABLET | Freq: Two times a day (BID) | ORAL | 0 refills | Status: DC

## 2018-04-22 NOTE — Assessment & Plan Note (Signed)
Rx for bactrim 5 day course. She is leaving the country tomorrow and is asked to take this if worsening pain or redness. 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

## 2018-04-22 NOTE — Progress Notes (Signed)
   Subjective:    Patient ID: Jill Frank, female    DOB: [birth year], 42 y.o.   MRN: [id number]  HPI The patient is a 42 YO female coming in for possible toe infection. Had a pedicure within the last several weeks. Denies fevers or chills. Denies red rash on the skin. Some drainage from the area. Some pain. Started 2-3 days ago. Overall stable since onset. Has tried to get some drainage out of it. Has not used anything for it. Pain 2-3/10. 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   Review of Systems  Constitutional: Negative for activity change, appetite change, chills, fatigue, fever and unexpected weight change.  Respiratory: Negative.   Cardiovascular: Negative.   Gastrointestinal: Negative.   Musculoskeletal: Positive for arthralgias and myalgias. Negative for back pain, gait problem and joint swelling.  Skin: Positive for color change and wound.  Neurological: Negative.       Objective:   Physical Exam  Constitutional: She is oriented to person, place, and time. She appears well-developed and well-nourished.  HENT:  Head: Normocephalic and atraumatic.  Eyes: EOM are normal.  Neck: Normal range of motion.  Cardiovascular: Normal rate.  Pulmonary/Chest: Effort normal.  Abdominal: Soft.  Musculoskeletal: She exhibits no edema.  Neurological: She is alert and oriented to person, place, and time. Coordination normal.  Skin: Skin is warm and dry.  Infection of the lateral cuticle left great toe, with some spontaneous drainage of clear serosanguinous fluid.   Vitals:   04/22/18 1102  BP: 104/78  Pulse: 80  Temp: 98.3 F (36.8 C)  TempSrc: Oral  SpO2: 99%  Weight: 196 lb (88.9 kg)  Height: 5\' 5"  (1.651 m)      Assessment & Plan:

## 2018-04-22 NOTE — Patient Instructions (Signed)
We have sent in bactrim to take with you in case needed.

## 2018-04-26 ENCOUNTER — Ambulatory Visit: Payer: BC Managed Care – PPO | Admitting: Sports Medicine

## 2018-04-26 ENCOUNTER — Encounter: Payer: Self-pay | Admitting: Sports Medicine

## 2018-04-26 VITALS — BP 112/78 | HR 76 | Ht 65.0 in | Wt 197.6 lb

## 2018-04-26 DIAGNOSIS — M9903 Segmental and somatic dysfunction of lumbar region: Secondary | ICD-10-CM

## 2018-04-26 DIAGNOSIS — M9905 Segmental and somatic dysfunction of pelvic region: Secondary | ICD-10-CM

## 2018-04-26 DIAGNOSIS — M4722 Other spondylosis with radiculopathy, cervical region: Secondary | ICD-10-CM

## 2018-04-26 DIAGNOSIS — M9902 Segmental and somatic dysfunction of thoracic region: Secondary | ICD-10-CM

## 2018-04-26 DIAGNOSIS — M25552 Pain in left hip: Secondary | ICD-10-CM | POA: Diagnosis not present

## 2018-04-26 DIAGNOSIS — M9901 Segmental and somatic dysfunction of cervical region: Secondary | ICD-10-CM

## 2018-04-26 DIAGNOSIS — M542 Cervicalgia: Secondary | ICD-10-CM | POA: Diagnosis not present

## 2018-04-26 DIAGNOSIS — M9908 Segmental and somatic dysfunction of rib cage: Secondary | ICD-10-CM

## 2018-04-26 NOTE — Progress Notes (Signed)
Jill Frank. Jill Frank, [facility] - 42 y.o. female MRN [id number]  Date of birth: [birth year]  Visit Date: 04/26/2018  PCP: Briscoe Deutscher, DO   Referred by: Briscoe Deutscher, DO  Scribe(s) for today's visit: Wendy Poet, LAT, ATC  SUBJECTIVE:  Jill Frank is here for Follow-up (neck and L shoulder pain) .    Jill Frank is a new patient presenting today for evaluation of LT wrist and shoulder pain.  Sx have been worse over the past 3 months. The pain is described as mild aching and discomfort. She feels like the wrist catches. She has tenderness to palpation on the radial aspect of the wrist.  Pain is rated as 0/10 at rest but 5/10 with use. Worsened with movement, even worse when using her thumb.  Improves with rest Therapies tried include : she has had steroid injections in the past. She has tried taking Meloxicam with minimal relief Other associated symptoms include: Pain does radiate a couple of inches into the radial aspect of the forearm.  Shoulder pain has been present x several years but has gotten worse over the past 3 months or so. She has no known injury or trauma to the shoulder. She just moved to the area the end of June 2018. She relates some of her increased pain to this. Pain is mostly on the anterior and posterior aspect of the shoulder. The pain radiates about half way down the triceps. Pain is worse at night, she is unable to lay on her LT side without her shoulder aching and burning. Pain is worse with internal and external rotation and adduction. Pain worsens the higher she raises her arm. She reports crepitus. Pain improves with rest. She has taken Meloxicam with minimal relief. She has not tried using heat or ice on the area because the pain comes and goes.  No recent xray of the shoulder or wrist.   Notes from [facility] on 10/29/17: Compared to the last office visit on 06/28/17, her  previously described L shoulder and wrist pain symptoms are worsening w/ increased pain in the L arm w/ N/T also noted in the L UE from the shoulder to the hand. Current symptoms are moderate & are radiating to the L UE. She has been taking Meloxicam daily. Would also like to talk about her L hip.  Prior hx of L hip ant/post labral repair (2016) - Jill Frank.  12/06/2017: Compared to the last office visit on 10/29/17, her previously described neck and L arm pain symptoms are improving.  She states that the neck pain hasn't really changed but notes that the L arm pain has improved.  She states that she con't to have N/T into her L arm. Current symptoms are moderate & are radiating into the L UE. She has been taking the Gabapentin and feels this helps. She is also taking Meloxicam daily.  Taking Zithromax for a UTI currently (day 3)  02/25/2018: Compared to the last office visit, her previously described symptoms are improving. She also has c/o multiple musculoskeletal aches and pains.  Current symptoms are moderate & are radiating to multiple areas including L arm. She has been taking Gabapentin and Meloxicam with some relief. She started following Nitro Protocol about 1 month ago and reports no trouble with HA. She feels that the Nitroglycerin patches are helping.  She c/o L-sided epicondylitis. She is having more trouble with pelvic movement. She has  been doing HEP intermittently but is doing more outdoor yard work than anything else. She has also started Yoga but her elbow pain makes this difficult. She tried usuing cervical traction pillow but found it to be more bothersome than beneficial.   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  She states that she is feeling more muscle pain today.  She notes that she feels the cervical OMT improves her symptoms for a few days and finds this treatment  beneficial. Current symptoms are moderate & are radiating to L arm. She has been taking Gabapentin and Meloxicam with some relief.   She has been following Nitro Protocol with L hip pain and feels this has been beneficial.   04/11/18: Compared to the last office visit on 03/11/2018, her previously described neck and radiating L arm pain symptoms show no change.  She states that she feels like her L shoulder is more problematic at this point and is wondering if there is something separately wrong vs just referred pain. Current symptoms are moderate & are radiating to L arm. She has been taking Gabapentin and Meloxicam.  She has attended 1 PT session and doesn't plan to continue.  She has been receiving OMT treatment w/ Dr. Paulla Frank.  She states that she didn't do well w/ the cervical traction pillow but has purchased a new heating pad and feels this helped.  She has been following the nitro protocol for her L hip and feels this has been beneficial.  04/26/2018: Compared to the last office visit on 04/11/18, her previously described neck and radiating L arm pain symptoms are improving w/ improved cervical ROM noted. Current symptoms are moderate & are radiating to L upper arm but less than previously.  She notes that she has been experiencing decreased N/T as well in her L UE. She has been taking Gabapentin and Meloxicam.  She received the [employer] at her last visit but has not looked at these.  She was also given towel stretches for her neck and is doing these.   REVIEW OF SYSTEMS: Reports night time disturbances. Denies fevers, chills, or night sweats. Denies unexplained weight loss. Denies personal history of cancer. Denies changes in bowel or bladder habits. Denies recent unreported falls. Denies new or worsening dyspnea or wheezing. Denies headaches or dizziness.  Reports numbness, tingling or weakness  In the extremities - in the L hand but less than previously Denies dizziness  or presyncopal episodes Denies lower extremity edema    HISTORY & PERTINENT PRIOR DATA:  Prior History reviewed and updated per electronic medical record.  Significant/pertinent history, findings, studies include:  reports that she has never smoked. She has never used smokeless tobacco. No results for input(s): HGBA1C, LABURIC, CREATINE in the last 8760 hours. No specialty comments available. No problems updated.  OBJECTIVE:  VS:  HT:5\' 5"  (165.1 cm)   WT:197 lb 9.6 oz (89.6 kg)  BMI:32.88    BP:112/78  HR:76bpm  TEMP: ( )  RESP:99 %   PHYSICAL EXAM: Constitutional: WDWN, Non-toxic appearing. Psychiatric: Alert & appropriately interactive.  Not depressed or anxious appearing. Respiratory: No increased work of breathing.  Trachea Midline Eyes: Pupils are equal.  EOM intact without nystagmus.  No scleral icterus  Vascular Exam: warm to touch no edema  upper and lower extremity neuro exam: unremarkable normal strength normal sensation  MSK Exam: She is able to heel and toe walk without  difficulty.  Overall her symptoms are improving and she has improved thoracic rotation both to the right and left.  She is able to heel and toe walk without difficulty.   ASSESSMENT & PLAN:   1. 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 Neck pain   3. Osteoarthritis of spine with radiculopathy, cervical region   4. Somatic dysfunction of cervical region   5. Somatic dysfunction of thoracic region   6. Somatic dysfunction of lumbar region   7. Somatic dysfunction of pelvis region   8. Somatic dysfunction of rib cage region     PLAN: Osteopathic manipulation was performed today based on physical exam findings.  Please see procedure note for further information including Osteopathic Exam findings  She continues to do well with this as well as the home exercise program.   Follow-up: Return in about 2 weeks (around 05/10/2018) for consideration of repeat osteopathic  manipulation.      Please see additional documentation for Objective, Assessment and Plan sections. Pertinent additional documentation may be included in corresponding procedure notes, imaging studies, problem based documentation and patient instructions. Please see these sections of the encounter for additional information regarding this visit.  CMA/ATC served as Education administrator during this visit. History, Physical, and Plan performed by medical provider. Documentation and orders reviewed and attested to.      Gerda Diss, [facility] Physician

## 2018-04-26 NOTE — Progress Notes (Signed)
PROCEDURE NOTE : OSTEOPATHIC MANIPULATION The decision today to treat with Osteopathic Manipulative Therapy (OMT) was based on physical exam findings. Verbal consent was obtained following a discussion with the patient regarding the of risks, benefits and potential side effects, including an acute pain flare,post manipulation soreness and need for repeat treatments.     Contraindications to OMT: NONE  Manipulation was performed as below: Regions treated: Cervical spine, Ribs, Thoracic spine, Lumbar spine, Pelvis and Sacrum OMT Techniques Used: HVLA, muscle energy and myofascial release  The patient tolerated the treatment well and reported Improved symptoms following treatment today. Patient was given medications, exercises, stretches and lifestyle modifications per AVS and verbally.   OSTEOPATHIC/STRUCTURAL EXAM:   C2 through C4 FRS left C5 FRS right T2 extended side bent right T4 through T6 rotated left L3 FRS right Left on left sacral torsion Right anterior innominate Posterior rib 4 on the right

## 2018-05-02 ENCOUNTER — Encounter: Payer: Self-pay | Admitting: Sports Medicine

## 2018-05-12 ENCOUNTER — Ambulatory Visit: Payer: BC Managed Care – PPO | Admitting: Sports Medicine

## 2018-05-13 ENCOUNTER — Ambulatory Visit: Payer: BC Managed Care – PPO | Admitting: Sports Medicine

## 2018-05-13 ENCOUNTER — Encounter: Payer: Self-pay | Admitting: Sports Medicine

## 2018-05-13 VITALS — BP 116/70 | HR 90 | Ht 65.0 in | Wt 200.6 lb

## 2018-05-13 DIAGNOSIS — M9905 Segmental and somatic dysfunction of pelvic region: Secondary | ICD-10-CM

## 2018-05-13 DIAGNOSIS — M542 Cervicalgia: Secondary | ICD-10-CM | POA: Diagnosis not present

## 2018-05-13 DIAGNOSIS — M9901 Segmental and somatic dysfunction of cervical region: Secondary | ICD-10-CM | POA: Diagnosis not present

## 2018-05-13 DIAGNOSIS — M9908 Segmental and somatic dysfunction of rib cage: Secondary | ICD-10-CM

## 2018-05-13 DIAGNOSIS — M9902 Segmental and somatic dysfunction of thoracic region: Secondary | ICD-10-CM | POA: Diagnosis not present

## 2018-05-13 DIAGNOSIS — M9903 Segmental and somatic dysfunction of lumbar region: Secondary | ICD-10-CM

## 2018-05-13 DIAGNOSIS — M25552 Pain in left hip: Secondary | ICD-10-CM | POA: Diagnosis not present

## 2018-05-13 NOTE — Progress Notes (Signed)
Jill Frank. Jill Frank, [facility] - 42 y.o. female MRN [id number]  Date of birth: [birth year]  Visit Date: 05/13/2018  PCP: Briscoe Deutscher, DO   Referred by: Briscoe Deutscher, DO  Scribe(s) for today's visit: Wendy Poet, LAT, ATC  SUBJECTIVE:  Jill Frank is here for Follow-up (neck and L arm pain) .    Jill Frank is a new patient presenting today for evaluation of LT wrist and shoulder pain.  Sx have been worse over the past 3 months. The pain is described as mild aching and discomfort. She feels like the wrist catches. She has tenderness to palpation on the radial aspect of the wrist.  Pain is rated as 0/10 at rest but 5/10 with use. Worsened with movement, even worse when using her thumb.  Improves with rest Therapies tried include : she has had steroid injections in the past. She has tried taking Meloxicam with minimal relief Other associated symptoms include: Pain does radiate a couple of inches into the radial aspect of the forearm.  Shoulder pain has been present x several years but has gotten worse over the past 3 months or so. She has no known injury or trauma to the shoulder. She just moved to the area the end of June 2018. She relates some of her increased pain to this. Pain is mostly on the anterior and posterior aspect of the shoulder. The pain radiates about half way down the triceps. Pain is worse at night, she is unable to lay on her LT side without her shoulder aching and burning. Pain is worse with internal and external rotation and adduction. Pain worsens the higher she raises her arm. She reports crepitus. Pain improves with rest. She has taken Meloxicam with minimal relief. She has not tried using heat or ice on the area because the pain comes and goes.  No recent xray of the shoulder or wrist.   Notes from [facility] on 10/29/17: Compared to the last office visit on 06/28/17, her  previously described L shoulder and wrist pain symptoms are worsening w/ increased pain in the L arm w/ N/T also noted in the L UE from the shoulder to the hand. Current symptoms are moderate & are radiating to the L UE. She has been taking Meloxicam daily. Would also like to talk about her L hip.  Prior hx of L hip ant/post labral repair (2016) - Dr. Dimas Chyle.  12/06/2017: Compared to the last office visit on 10/29/17, her previously described neck and L arm pain symptoms are improving.  She states that the neck pain hasn't really changed but notes that the L arm pain has improved.  She states that she con't to have N/T into her L arm. Current symptoms are moderate & are radiating into the L UE. She has been taking the Gabapentin and feels this helps. She is also taking Meloxicam daily.  Taking Zithromax for a UTI currently (day 3)  02/25/2018: Compared to the last office visit, her previously described symptoms are improving. She also has c/o multiple musculoskeletal aches and pains.  Current symptoms are moderate & are radiating to multiple areas including L arm. She has been taking Gabapentin and Meloxicam with some relief. She started following Nitro Protocol about 1 month ago and reports no trouble with HA. She feels that the Nitroglycerin patches are helping.  She c/o L-sided epicondylitis. She is having more trouble with pelvic movement. She has  been doing HEP intermittently but is doing more outdoor yard work than anything else. She has also started Yoga but her elbow pain makes this difficult. She tried usuing cervical traction pillow but found it to be more bothersome than beneficial.   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  She states that she is feeling more muscle pain today.  She notes that she feels the cervical OMT improves her symptoms for a few days and finds this treatment  beneficial. Current symptoms are moderate & are radiating to L arm. She has been taking Gabapentin and Meloxicam with some relief.   She has been following Nitro Protocol with L hip pain and feels this has been beneficial.   04/11/18: Compared to the last office visit on 03/11/2018, her previously described neck and radiating L arm pain symptoms show no change.  She states that she feels like her L shoulder is more problematic at this point and is wondering if there is something separately wrong vs just referred pain. Current symptoms are moderate & are radiating to L arm. She has been taking Gabapentin and Meloxicam.  She has attended 1 PT session and doesn't plan to continue.  She has been receiving OMT treatment w/ Dr. Paulla Fore.  She states that she didn't do well w/ the cervical traction pillow but has purchased a new heating pad and feels this helped.  She has been following the nitro protocol for her L hip and feels this has been beneficial.  04/26/2018: Compared to the last office visit on 04/11/18, her previously described neck and radiating L arm pain symptoms are improving w/ improved cervical ROM noted. Current symptoms are moderate & are radiating to L upper arm but less than previously.  She notes that she has been experiencing decreased N/T as well in her L UE. She has been taking Gabapentin and Meloxicam.  She received the [employer] at her last visit but has not looked at these.  She was also given towel stretches for her neck and is doing these.  05/13/2018: Compared to the last office visit on 04/26/18, her previously described neck and L arm pain symptoms are improving overall.  She states that she's been trying to workout more at the gym. Current symptoms are mild & are radiating to L upper arm occasionally but much less than before. She has been taking Gabapentin and Meloxicam.  She has been doing her towel stretches.  She has tried the [employer] some.   REVIEW OF  SYSTEMS: Reports night time disturbances.  Rarely. Denies fevers, chills, or night sweats. Denies unexplained weight loss. Denies personal history of cancer. Denies changes in bowel or bladder habits. Denies recent unreported falls. Denies new or worsening dyspnea or wheezing. Denies headaches or dizziness.  Reports numbness, tingling or weakness  In the extremities - in the L hand rarely Denies dizziness or presyncopal episodes Denies lower extremity edema    HISTORY & PERTINENT PRIOR DATA:  Prior History reviewed and updated per electronic medical record.  Significant/pertinent history, findings, studies include:  reports that she has never smoked. She has never used smokeless tobacco. No results for input(s): HGBA1C, LABURIC, CREATINE in the last 8760 hours. No specialty comments available. No problems updated.  OBJECTIVE:  VS:  HT:5\' 5"  (165.1 cm)   WT:200 lb 9.6 oz (91 kg)  BMI:33.38    BP:116/70  HR:90bpm  TEMP: ( )  RESP:97 %  PHYSICAL EXAM: CONSTITUTIONAL: Well-developed, Well-nourished and In no acute distress Alert & appropriately interactive. and Not depressed or anxious appearing. Respiratory: No increased work of breathing.  Trachea Midline EYES: Pupils are equal., EOM intact without nystagmus. and No scleral icterus.  Upper and Lower EXTREMITY EXAM: Warm and well perfused NEURO: unremarkable  MSK Exam: Negative straight leg raise bilaterally. Good internal and external rotation of bilateral hips although she does have a slight rotation of the left hip.  No significant mechanical symptoms.  No neural tension signs of both upper and lower extremities.  PROCEDURES & DATA REVIEWED:     ASSESSMENT   1. Pain of left hip joint   2. Neck pain   3. Somatic dysfunction of cervical region   4. Somatic dysfunction of thoracic region   5. Somatic dysfunction of lumbar region   6. Somatic dysfunction of pelvis region   7. Somatic dysfunction of rib cage  region     PLAN:   Osteopathic manipulation was performed today based on physical exam findings.  Please see procedure note for further information including Osteopathic Exam findings  She has been doing quite well.  She has had significant improvements in her symptoms with the osteopathic manipulation as well as increasing her physical activity level.  We will plan to check in with her in 4 weeks and repeat manipulation at that time if having ongoing problems.  We did spend time today discussing the merits of ensuring that she prioritizes exercise in her day-to-day schedule.  No problem-specific Assessment & Plan notes found for this encounter. No orders of the defined types were placed in this encounter. No orders of the defined types were placed in this encounter.    Follow-up: Return in about 4 weeks (around 06/10/2018).      Please see additional documentation for Objective, Assessment and Plan sections. Pertinent additional documentation may be included in corresponding procedure notes, imaging studies, problem based documentation and patient instructions. Please see these sections of the encounter for additional information regarding this visit.  CMA/ATC served as Education administrator during this visit. History, Physical, and Plan performed by medical provider. Documentation and orders reviewed and attested to.      Gerda Diss, [facility] Physician

## 2018-05-15 NOTE — Procedures (Signed)
PROCEDURE NOTE : OSTEOPATHIC MANIPULATION The decision today to treat with Osteopathic Manipulative Therapy (OMT) was based on physical exam findings. Verbal consent was obtained following a discussion with the patient regarding the of risks, benefits and potential side effects, including an acute pain flare,post manipulation soreness and need for repeat treatments.     Contraindications to OMT: NONE  Manipulation was performed as below: Regions treated: Cervical spine, Ribs, Thoracic spine, Lumbar spine, Pelvis and Sacrum OMT Techniques Used: HVLA, muscle energy and myofascial release  The patient tolerated the treatment well and reported Improved symptoms following treatment today. Patient was given medications, exercises, stretches and lifestyle modifications per AVS and verbally.   OSTEOPATHIC/STRUCTURAL EXAM:   OA - Right C2 FRS right (Flexed, Rotated & Sidebent) C5 - C6 Neutral, Rotated left, Sidebent right T2 ERS left (Extended, Rotated & Sidebent) T6 - T8 Neutral, Rotated left, Sidebent right Rib 6 Right  Posterior L3 FRS right (Flexed, Rotated & Sidebent) Right psoas spasm Right anterior innonimate SACRUM: L on L sacral torsion

## 2018-05-19 ENCOUNTER — Other Ambulatory Visit: Payer: Self-pay

## 2018-05-19 DIAGNOSIS — F331 Major depressive disorder, recurrent, moderate: Secondary | ICD-10-CM

## 2018-05-19 MED ORDER — BUPROPION HCL ER (XL) 150 MG PO TB24: 150.0000 mg | ORAL_TABLET | Freq: Three times a day (TID) | ORAL | 1 refills | Status: DC

## 2018-05-20 ENCOUNTER — Ambulatory Visit: Payer: BC Managed Care – PPO | Admitting: Family Medicine

## 2018-06-10 ENCOUNTER — Encounter: Payer: Self-pay | Admitting: Sports Medicine

## 2018-06-10 ENCOUNTER — Ambulatory Visit: Payer: BC Managed Care – PPO | Admitting: Sports Medicine

## 2018-06-10 VITALS — BP 116/76 | HR 84 | Ht 65.0 in | Wt 196.6 lb

## 2018-06-10 DIAGNOSIS — M25552 Pain in left hip: Secondary | ICD-10-CM | POA: Diagnosis not present

## 2018-06-10 DIAGNOSIS — M4722 Other spondylosis with radiculopathy, cervical region: Secondary | ICD-10-CM

## 2018-06-10 DIAGNOSIS — M9903 Segmental and somatic dysfunction of lumbar region: Secondary | ICD-10-CM

## 2018-06-10 DIAGNOSIS — M9908 Segmental and somatic dysfunction of rib cage: Secondary | ICD-10-CM

## 2018-06-10 DIAGNOSIS — Z23 Encounter for immunization: Secondary | ICD-10-CM | POA: Diagnosis not present

## 2018-06-10 DIAGNOSIS — M9901 Segmental and somatic dysfunction of cervical region: Secondary | ICD-10-CM

## 2018-06-10 DIAGNOSIS — M9904 Segmental and somatic dysfunction of sacral region: Secondary | ICD-10-CM

## 2018-06-10 DIAGNOSIS — M9902 Segmental and somatic dysfunction of thoracic region: Secondary | ICD-10-CM

## 2018-06-10 DIAGNOSIS — M542 Cervicalgia: Secondary | ICD-10-CM

## 2018-06-10 DIAGNOSIS — M9905 Segmental and somatic dysfunction of pelvic region: Secondary | ICD-10-CM

## 2018-06-10 NOTE — Progress Notes (Signed)
[name], [facility] [place] - 42 y.o. female MRN [id number]  Date of birth: 1976-04-17  Visit Date: 06/10/2018  PCP: Briscoe Deutscher, DO   Referred by: Briscoe Deutscher, DO  Scribe(s) for today's visit: Wendy Poet, LAT, ATC  SUBJECTIVE:  Jill Frank is here for Follow-up (Left hip and back pain) .    Jill Frank is a new patient presenting today for evaluation of LT wrist and shoulder pain.  Sx have been worse over the past 3 months. The pain is described as mild aching and discomfort. She feels like the wrist catches. She has tenderness to palpation on the radial aspect of the wrist.  Pain is rated as 0/10 at rest but 5/10 with use. Worsened with movement, even worse when using her thumb.  Improves with rest Therapies tried include : she has had steroid injections in the past. She has tried taking Meloxicam with minimal relief Other associated symptoms include: Pain does radiate a couple of inches into the radial aspect of the forearm.  Shoulder pain has been present x several years but has gotten worse over the past 3 months or so. She has no known injury or trauma to the shoulder. She just moved to the area the end of June 2018. She relates some of her increased pain to this. Pain is mostly on the anterior and posterior aspect of the shoulder. The pain radiates about half way down the triceps. Pain is worse at night, she is unable to lay on her LT side without her shoulder aching and burning. Pain is worse with internal and external rotation and adduction. Pain worsens the higher she raises her arm. She reports crepitus. Pain improves with rest. She has taken Meloxicam with minimal relief. She has not tried using heat or ice on the area because the pain comes and goes.  No recent xray of the shoulder or wrist.   Notes from [place] on 10/29/17: Compared to the last office visit on 06/28/17, her  previously described L shoulder and wrist pain symptoms are worsening w/ increased pain in the L arm w/ N/T also noted in the L UE from the shoulder to the hand. Current symptoms are moderate & are radiating to the L UE. She has been taking Meloxicam daily. Would also like to talk about her L hip.  Prior hx of L hip ant/post labral repair (2016) - Dr. Dimas Chyle.  12/06/2017: Compared to the last office visit on 10/29/17, her previously described neck and L arm pain symptoms are improving.  She states that the neck pain hasn't really changed but notes that the L arm pain has improved.  She states that she con't to have N/T into her L arm. Current symptoms are moderate & are radiating into the L UE. She has been taking the Gabapentin and feels this helps. She is also taking Meloxicam daily.  Taking Zithromax for a UTI currently (day 3)  02/25/2018: Compared to the last office visit, her previously described symptoms are improving. She also has c/o multiple musculoskeletal aches and pains.  Current symptoms are moderate & are radiating to multiple areas including L arm. She has been taking Gabapentin and Meloxicam with some relief. She started following Nitro Protocol about 1 month ago and reports no trouble with HA. She feels that the Nitroglycerin patches are helping.  She c/o L-sided epicondylitis. She is having more trouble with pelvic movement. She has  been doing HEP intermittently but is doing more outdoor yard work than anything else. She has also started Yoga but her elbow pain makes this difficult. She tried usuing cervical traction pillow but found it to be more bothersome than beneficial.   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  She states that she is feeling more muscle pain today.  She notes that she feels the cervical OMT improves her symptoms for a few days and finds this treatment  beneficial. Current symptoms are moderate & are radiating to L arm. She has been taking Gabapentin and Meloxicam with some relief.   She has been following Nitro Protocol with L hip pain and feels this has been beneficial.   04/11/18: Compared to the last office visit on 03/11/2018, her previously described neck and radiating L arm pain symptoms show no change.  She states that she feels like her L shoulder is more problematic at this point and is wondering if there is something separately wrong vs just referred pain. Current symptoms are moderate & are radiating to L arm. She has been taking Gabapentin and Meloxicam.  She has attended 1 PT session and doesn't plan to continue.  She has been receiving OMT treatment w/ Dr. Paulla Fore.  She states that she didn't do well w/ the cervical traction pillow but has purchased a new heating pad and feels this helped.  She has been following the nitro protocol for her L hip and feels this has been beneficial.  04/26/2018: Compared to the last office visit on 04/11/18, her previously described neck and radiating L arm pain symptoms are improving w/ improved cervical ROM noted. Current symptoms are moderate & are radiating to L upper arm but less than previously.  She notes that she has been experiencing decreased N/T as well in her L UE. She has been taking Gabapentin and Meloxicam.  She received the [employer] at her last visit but has not looked at these.  She was also given towel stretches for her neck and is doing these.  05/13/2018: Compared to the last office visit on 04/26/18, her previously described neck and L arm pain symptoms are improving overall.  She states that she's been trying to workout more at the gym. Current symptoms are mild & are radiating to L upper arm occasionally but much less than before. She has been taking Gabapentin and Meloxicam.  She has been doing her towel stretches.  She has tried the [employer]  some.  06/10/2018: Compared to the last office visit on 05/13/18, her previously described neck and L shoulder symptoms show no change since her last visit.  She states that she's had a very busy week this week w/ work. Current symptoms are mild & are radiating to the L upper arm She has been taking Gabapentin bid-tid and Meloxicam.  She has been doing her towel stretches.  She has done more of the [employer].  REVIEW OF SYSTEMS: Reports night time disturbances.  Rarely. Denies fevers, chills, or night sweats. Denies unexplained weight loss. Denies personal history of cancer. Denies changes in bowel or bladder habits. Denies recent unreported falls. Denies new or worsening dyspnea or wheezing. Denies headaches or dizziness.  Reports numbness, tingling or weakness  In the extremities - in the L hand rarely Denies dizziness or presyncopal episodes Denies lower extremity edema    HISTORY:  Prior history reviewed and updated per electronic  medical record.  Social History   Occupational History  . Not on file  Tobacco Use  . Smoking status: Never Smoker  . Smokeless tobacco: Never Used  Substance and Sexual Activity  . Alcohol use: Yes    Comment: Social --1 per week  . Drug use: No  . Sexual activity: Yes    Birth control/protection: IUD    Comment: Mirena IUD inserted 02/2014   Social History   Social History Narrative  . Not on file      DATA OBTAINED & REVIEWED:  No results for input(s): HGBA1C, LABURIC, CREATINE in the last 8760 hours. . S/p left hip arthroscopy with labral repair in 2015 .    OBJECTIVE:  VS:  HT:5\' 5"  (165.1 cm)   WT:196 lb 9.6 oz (89.2 kg)  BMI:32.72    BP:116/76  HR:84bpm  TEMP: ( )  RESP:97 %   PHYSICAL EXAM: CONSTITUTIONAL: Well-developed, Well-nourished and In no acute distress PSYCHIATRIC: Alert & appropriately interactive. and Not depressed or anxious appearing. RESPIRATORY: No increased work of breathing and Trachea  Midline EYES: Pupils are equal., EOM intact without nystagmus. and No scleral icterus.  Upper and Lower extremities: Warm and well perfused NEURO: unremarkable Normal associated myotomal distribution strength to manual muscle testing Normal sensation to light touch Normal and symmetric associated DTRs  MSK Exam: Bilateral hip, cervical spine, lumbar spine  . Well aligned, no significant deformity. . No focal bony tenderness . No overlying skin changes.   RANGE OF MOTION & STRENGTH  . Normal, non-painful Internal and external range of motion of bilateral hips.  Good flexion extension of her back.   SPECIALITY TESTING:  Log Roll: normal, no pain FADIR: normal, no pain FABER: normal, no pain Stinchfield testing: normal, no pain Strength: Normal Axial loading produces: Mild pain and No crepitation        RIGHT    LEFT Straight leg raise-------------------------: normal, no pain                         normal, no pain       Neck:   NEURAL TENSION SIGNS Right Left  Brachial Plexus Squeeze: normal, no pain normal, no pain  Arm Squeeze Test: normal, no pain normal, no pain  Spurling's Compression Test: normal, no pain normal, no pain  Lhermitte's Compression test: Negative, no radiating pain      ASSESSMENT   1. Pain of left hip joint   2. Neck pain   3. Somatic dysfunction of cervical region   4. Somatic dysfunction of thoracic region   5. Somatic dysfunction of lumbar region   6. Somatic dysfunction of pelvis region   7. Somatic dysfunction of rib cage region   8. Greater trochanteric pain syndrome of left lower extremity   9. Osteoarthritis of spine with radiculopathy, cervical region   10. Somatic dysfunction of sacral region   11. Need for immunization against influenza     PROCEDURES  Osteopathic manipulation was performed today based on physical exam findings.  Please see procedure note for further information including Osteopathic Exam findings  PLAN:    No orders of the defined types were placed in this encounter.   Pertinent additional documentation may be included in corresponding procedure notes, imaging studies, problem based documentation and patient instructions.  Flu shot provided today  Continue previously prescribed home exercise program.   Discussed red flag symptoms that warrant earlier emergent evaluation and patient voices understanding.  Activity  modifications and the importance of avoiding exacerbating activities (limiting pain to no more than a 4 / 10 during or following activity) recommended and discussed.  Follow-up: Return in about 4 weeks (around 07/08/2018) for consideration of repeat osteopathic manipulation.   If any lack of improvement consider further diagnostic evaluation with Repeat imaging of the lumbar and/or cervical spine.     CMA/ATC served as Education administrator during this visit. History, Physical, and Plan performed by medical provider.  Documentation and orders reviewed and attested to.      Gerda Diss, [facility] Physician

## 2018-06-10 NOTE — Progress Notes (Deleted)
PROCEDURE NOTE : OSTEOPATHIC MANIPULATION The decision today to treat with Osteopathic Manipulative Therapy (OMT) was based on physical exam findings. Verbal consent was obtained following a discussion with the patient regarding the of risks, benefits and potential side effects, including an acute pain flare,post manipulation soreness and need for repeat treatments. {Blank single:19197::"Additionally, we specifically discussed the minimal risk of  injury to neurovascular structures associated with Cervical manipulation."," "}   {Contraindications to OMT:21555::"NONE"}  Manipulation was performed as below: {Regions treated:21128:::1} {OMT Techniques Used:21101::"HVLA","muscle energy","myofascial release":1}  The patient tolerated the treatment well and reported {CHL AMB ORT SYMPTOMS POST TREATMENT:21798::"Improved"} symptoms following treatment today. Patient was given medications, exercises, stretches and lifestyle modifications per AVS and verbally.   OSTEOPATHIC/STRUCTURAL EXAM:   {-LBSMOMTFINDINGS:21144}

## 2018-06-10 NOTE — Patient Instructions (Signed)
Look into getting a HyperVolt

## 2018-06-10 NOTE — Progress Notes (Signed)
PROCEDURE NOTE : OSTEOPATHIC MANIPULATION The decision today to treat with Osteopathic Manipulative Therapy (OMT) was based on physical exam findings. Verbal consent was obtained following a discussion with the patient regarding the of risks, benefits and potential side effects, including an acute pain flare,post manipulation soreness and need for repeat treatments.     Contraindications to OMT: NONE  Manipulation was performed as below: Regions treated: Cervical spine, Thoracic spine, Ribs, Lumbar spine, Pelvis and Sacrum OMT Techniques Used: HVLA, muscle energy and myofascial release  The patient tolerated the treatment well and reported Improved symptoms following treatment today. Patient was given medications, exercises, stretches and lifestyle modifications per AVS and verbally.   OSTEOPATHIC/STRUCTURAL EXAM:   OA - rotated right C2 FRS right (Flexed, Rotated & Sidebent) C6 ERS left T2 ERS left (Extended, Rotated & Sidebent) T6 -10 Neutral, Rotated RIGHT, Sidebent LEFT  Rib 6 Right  Posterior L4 FRS right (Flexed, Rotated & Sidebent) Right psoas spasm Right anterior innonimate L on L sacral torsion

## 2018-06-11 ENCOUNTER — Encounter: Payer: Self-pay | Admitting: Sports Medicine

## 2018-06-29 ENCOUNTER — Other Ambulatory Visit: Payer: Self-pay | Admitting: Sports Medicine

## 2018-06-29 ENCOUNTER — Other Ambulatory Visit: Payer: Self-pay | Admitting: Family Medicine

## 2018-06-29 DIAGNOSIS — M2550 Pain in unspecified joint: Secondary | ICD-10-CM

## 2018-06-29 DIAGNOSIS — M4722 Other spondylosis with radiculopathy, cervical region: Secondary | ICD-10-CM

## 2018-06-29 DIAGNOSIS — F331 Major depressive disorder, recurrent, moderate: Secondary | ICD-10-CM

## 2018-06-29 DIAGNOSIS — R61 Generalized hyperhidrosis: Secondary | ICD-10-CM

## 2018-06-29 DIAGNOSIS — J301 Allergic rhinitis due to pollen: Secondary | ICD-10-CM

## 2018-07-04 ENCOUNTER — Other Ambulatory Visit: Payer: Self-pay

## 2018-07-04 MED ORDER — NITROGLYCERIN 0.2 MG/HR TD PT24: MEDICATED_PATCH | TRANSDERMAL | 0 refills | Status: AC

## 2018-07-05 ENCOUNTER — Telehealth: Payer: Self-pay | Admitting: Family Medicine

## 2018-07-05 NOTE — Telephone Encounter (Signed)
Think this should have gone to you  

## 2018-07-05 NOTE — Telephone Encounter (Signed)
See note  Copied from [place] [phone]. Topic: Quick Communication - See Telephone Encounter >> Jul 05, 2018  3:34 PM Hewitt Shorts wrote: Pt pharmacy is calling to clarify the nitro patch rx it was just signed and not told the alternative since the nitro patch has been discontinued  [facility] [phone] ref [place] [phone]

## 2018-07-06 NOTE — Telephone Encounter (Signed)
See note

## 2018-07-06 NOTE — Telephone Encounter (Signed)
Patch requested is an extended release patch and it is not recommended this be cut in half.   Pharmacy still needs to know about the alternative as well as listed in original message. Call back:  [phone] Ref #: [id number]

## 2018-07-06 NOTE — Telephone Encounter (Signed)
Called [facility] and provided clarification for Nitro patches.

## 2018-07-07 NOTE — Progress Notes (Signed)
Jill Frank is a 42 y.o. female is here for follow up.  History of Present Illness:   HPI: See Assessment and Plan section for Problem Based Charting of issues discussed today.   Review: taking medications as instructed, no medication side effects noted, no TIAs, no chest pain on exertion, no dyspnea on exertion, no swelling of ankles, no palpitations.  Smoker: No.  Wt Readings from Last 3 Encounters:  07/08/18 196 lb 9.6 oz (89.2 kg)  07/08/18 196 lb 9.6 oz (89.2 kg)  06/10/18 196 lb 9.6 oz (89.2 kg)   BP Readings from Last 3 Encounters:  07/08/18 118/68  07/08/18 118/68  06/10/18 116/76   Lab Results  Component Value Date   CREATININE 0.76 12/14/2017   Review of Systems  Constitutional: Negative for chills, fever, malaise/fatigue and weight loss.  Respiratory: Negative for cough, shortness of breath and wheezing.   Cardiovascular: Negative for chest pain, palpitations and leg swelling.  Gastrointestinal: Negative for abdominal pain, constipation, diarrhea, nausea and vomiting.  Genitourinary: Negative for dysuria and urgency.  Musculoskeletal: Negative for joint pain and myalgias.  Skin: Negative for rash.  Neurological: Negative for dizziness and headaches.  Psychiatric/Behavioral: Negative for depression, substance abuse and suicidal ideas. The patient is not nervous/anxious.    There are no preventive care reminders to display for this patient.   Depression screen [facility] 2/9 02/25/2018 06/21/2017  Decreased Interest 3 2  Down, Depressed, Hopeless 1 1  PHQ - 2 Score 4 3  Altered sleeping 1 2  Tired, decreased energy 3 3  Change in appetite 1 2  Feeling bad or failure about yourself  1 1  Trouble concentrating 1 1  Moving slowly or fidgety/restless 0 1  Suicidal thoughts 0 0  PHQ-9 Score 11 13  Difficult doing work/chores Somewhat difficult -   PMHx, SurgHx, SocialHx, FamHx, Medications, and Allergies were reviewed in the Visit Navigator and updated as  appropriate.   Patient Active Problem List   Diagnosis Date Noted  . B12 deficiency 07/08/2018  . IUD (intrauterine device) in place 12/19/2017  . Greater trochanteric pain syndrome of left lower extremity 10/29/2017  . Osteoarthritis of spine with radiculopathy, cervical region 10/29/2017  . ADHD, on Vyvanse, followed by Dr. Toy Care 08/02/2017  . OSA (obstructive sleep apnea) 07/21/2017  . Polyarthralgia 06/28/2017  . Left wrist pain 06/28/2017  . Seasonal allergic rhinitis due to pollen, on Singulair, Flonase, and Astelin 06/23/2017  . Hyperhidrosis 06/23/2017  . Hypertension, on Benicar and Hytrin   . Depression    Social History   Tobacco Use  . Smoking status: Never Smoker  . Smokeless tobacco: Never Used  Substance Use Topics  . Alcohol use: Yes    Comment: Social --1 per week  . Drug use: No   Current Medications and Allergies:   .  ALPRAZolam (XANAX) 0.5 MG tablet, Take 0.5 mg by mouth as needed for anxiety (For air travel.). Marland Kitchen  azelastine (ASTELIN) 0.1 % nasal spray, Place 1 spray into both nostrils daily. Use in each nostril as directed, Disp: 30 mL, Rfl: 3 .  buPROPion (WELLBUTRIN XL) 150 MG 24 hr tablet, Take 1 tablet (150 mg total) by mouth 3 (three) times daily., Disp: 270 tablet, Rfl: 1 .  clonazePAM (KLONOPIN) 0.5 MG tablet, Take 1 tablet (0.5 mg total) by mouth 2 (two) times daily as needed for anxiety., Disp: 180 tablet, Rfl: 0 .  Cyanocobalamin (VITAMIN B 12 PO), Take by mouth daily. , Disp: , Rfl:  .  DULoxetine (CYMBALTA) 30 MG capsule, TAKE 1 CAPSULE 3 TIMES A   DAY, Disp: 270 capsule, Rfl: 1 .  Fluocin-Hydroquinone-Tretinoin (TRI-LUMA) 0.01-4-0.05 % CREA, Apply daily as needed., Disp: 90 g, Rfl: 1 .  fluticasone (FLONASE) 50 MCG/ACT nasal spray, Place 1 spray into both nostrils daily., Disp: 16 g, Rfl: 2 .  gabapentin (NEURONTIN) 300 MG capsule, TAKE 1 CAPSULE 3 TIMES     DAILY AS NEEDED, Disp: 270 capsule, Rfl: 1 .  levonorgestrel (MIRENA) 20 MCG/24HR IUD, 1  each by Intrauterine route once., Disp: , Rfl:  .  meloxicam (MOBIC) 15 MG tablet, TAKE 1 TABLET DAILY., Disp: 90 tablet, Rfl: 1 .  montelukast (SINGULAIR) 10 MG tablet, TAKE 1 TABLET AT BEDTIME, Disp: 90 tablet, Rfl: 1 .  Multiple Vitamins-Minerals (VITAMIN D3 COMPLETE PO), Take by mouth., Disp: , Rfl:  .  nitroGLYCERIN (NITRODUR - DOSED IN MG/24 HR) 0.2 mg/hr patch, Place 1/4 to 1/2 of a patch over affected region. Remove and replace once daily.  Slightly alter skin placement daily, Disp: 90 patch, Rfl: 0 .  olmesartan (BENICAR) 40 MG tablet, Take 1 tablet (40 mg total) by mouth daily., Disp: 90 tablet, Rfl: 1 .  terazosin (HYTRIN) 10 MG capsule, TAKE 1 CAPSULE AT BEDTIME, Disp: 90 capsule, Rfl: 1 .  valACYclovir (VALTREX) 500 MG tablet, TAKE 1 TABLET DAILY, Disp: 90 tablet, Rfl: 1 .  VYVANSE 70 MG capsule, Take 1 capsule by mouth daily., Disp: , Rfl: 0   Allergies  Allergen Reactions  . Doxycycline Hives  . Erythromycin Hives  . Penicillins Hives   Review of Systems   Pertinent items are noted in the HPI. Otherwise, ROS is negative.  Vitals:   Vitals:   07/08/18 0839  BP: 118/68  Pulse: 96  Temp: 98.6 F (37 C)  TempSrc: Oral  SpO2: 97%  Weight: 196 lb 9.6 oz (89.2 kg)  Height: 5\' 5"  (1.651 m)     Body mass index is 32.72 kg/m.  Physical Exam:   Physical Exam  Constitutional: She appears well-nourished.  HENT:  Head: Normocephalic and atraumatic.  Eyes: Pupils are equal, round, and reactive to light. EOM are normal.  Neck: Normal range of motion. Neck supple.  Cardiovascular: Normal rate, regular rhythm, normal heart sounds and intact distal pulses.  Pulmonary/Chest: Effort normal.  Abdominal: Soft.  Skin: Skin is warm.  Psychiatric: She has a normal mood and affect. Her behavior is normal.  Nursing note and vitals reviewed.  Assessment and Plan:   Jill Frank was seen today for follow-up.  Diagnoses and all orders for this visit:  Attention deficit  hyperactivity disorder (ADHD), combined type Comments: Followed by Dr. Toy Care now. Doing well at current dose.   Situational anxiety Comments: Improved. Rarely using Klonopin and has plenty.  Recurrent major depressive disorder, in partial remission ([place]) Comments: Doing well on current medications. Will continue.  Seasonal allergic rhinitis due to pollen, on Singulair, Flonase, and Astelin Comments: States that she uses Zyrtec D when she gets sinus headaches. Fairly controlled now.   . Reviewed expectations re: course of current medical issues. . Discussed self-management of symptoms. . Outlined signs and symptoms indicating need for more acute intervention. . Patient verbalized understanding and all questions were answered. Marland Kitchen Health Maintenance issues including appropriate healthy diet, exercise, and smoking avoidance were discussed with patient. . See orders for this visit as documented in the electronic medical record. . Patient received an After Visit Summary.  Briscoe Deutscher, DO [place], [place]  07/08/2018    

## 2018-07-08 ENCOUNTER — Encounter: Payer: Self-pay | Admitting: Family Medicine

## 2018-07-08 ENCOUNTER — Encounter: Payer: Self-pay | Admitting: Sports Medicine

## 2018-07-08 ENCOUNTER — Ambulatory Visit: Payer: BC Managed Care – PPO | Admitting: Family Medicine

## 2018-07-08 ENCOUNTER — Ambulatory Visit: Payer: BC Managed Care – PPO | Admitting: Sports Medicine

## 2018-07-08 VITALS — BP 118/68 | HR 96 | Temp 98.6°F | Ht 65.0 in | Wt 196.6 lb

## 2018-07-08 VITALS — BP 118/68 | HR 96 | Ht 65.0 in | Wt 196.6 lb

## 2018-07-08 DIAGNOSIS — M9901 Segmental and somatic dysfunction of cervical region: Secondary | ICD-10-CM

## 2018-07-08 DIAGNOSIS — M9908 Segmental and somatic dysfunction of rib cage: Secondary | ICD-10-CM

## 2018-07-08 DIAGNOSIS — M24552 Contracture, left hip: Secondary | ICD-10-CM

## 2018-07-08 DIAGNOSIS — M25552 Pain in left hip: Secondary | ICD-10-CM

## 2018-07-08 DIAGNOSIS — M9905 Segmental and somatic dysfunction of pelvic region: Secondary | ICD-10-CM

## 2018-07-08 DIAGNOSIS — F902 Attention-deficit hyperactivity disorder, combined type: Secondary | ICD-10-CM | POA: Diagnosis not present

## 2018-07-08 DIAGNOSIS — F418 Other specified anxiety disorders: Secondary | ICD-10-CM

## 2018-07-08 DIAGNOSIS — Z1239 Encounter for other screening for malignant neoplasm of breast: Secondary | ICD-10-CM

## 2018-07-08 DIAGNOSIS — F3341 Major depressive disorder, recurrent, in partial remission: Secondary | ICD-10-CM | POA: Diagnosis not present

## 2018-07-08 DIAGNOSIS — M542 Cervicalgia: Secondary | ICD-10-CM | POA: Diagnosis not present

## 2018-07-08 DIAGNOSIS — E538 Deficiency of other specified B group vitamins: Secondary | ICD-10-CM | POA: Insufficient documentation

## 2018-07-08 DIAGNOSIS — J301 Allergic rhinitis due to pollen: Secondary | ICD-10-CM

## 2018-07-08 DIAGNOSIS — M9902 Segmental and somatic dysfunction of thoracic region: Secondary | ICD-10-CM

## 2018-07-08 DIAGNOSIS — M9903 Segmental and somatic dysfunction of lumbar region: Secondary | ICD-10-CM

## 2018-07-08 NOTE — Progress Notes (Signed)
PROCEDURE NOTE : OSTEOPATHIC MANIPULATION The decision today to treat with Osteopathic Manipulative Therapy (OMT) was based on physical exam findings. Verbal consent was obtained following a discussion with the patient regarding the of risks, benefits and potential side effects, including an acute pain flare,post manipulation soreness and need for repeat treatments. Additionally, we specifically discussed the minimal risk of  injury to neurovascular structures associated with Cervical manipulation.   Contraindications to OMT: NONE  Manipulation was performed as below: Regions Treated OMT Techniques Used  Cervical spine Thoracic spine Ribs Lumbar spine Pelvis Sacrum HVLA muscle energy myofascial release soft tissue   The patient tolerated the treatment well and reported Improved symptoms following treatment today. Patient was given medications, exercises, stretches and lifestyle modifications per AVS and verbally.    OSTEOPATHIC/STRUCTURAL EXAM:   OA - rotated right C2 FRS right (Flexed, Rotated & Sidebent) C6 ERS left T2 ERS left (Extended, Rotated & Sidebent) T6 -10 Neutral, Rotated RIGHT, Sidebent LEFT  Rib 6 Right  Posterior L4 FRS right (Flexed, Rotated & Sidebent) Left psoas spasm Right anterior innonimate L on L sacral torsion

## 2018-07-08 NOTE — Progress Notes (Signed)
[name]. Altair Appenzeller, [facility] - 42 y.o. female MRN [id number]  Date of birth: December 02, 1975  Visit Date: 07/08/2018  PCP: Briscoe Deutscher, DO   Referred by: Briscoe Deutscher, DO  Scribe(s) for today's visit: Josepha Pigg, CMA  SUBJECTIVE:  [name] is here for Follow-up (neck and L shoulder pain) .    Kalyn Montini is a new patient presenting today for evaluation of LT wrist and shoulder pain.  Sx have been worse over the past 3 months. The pain is described as mild aching and discomfort. She feels like the wrist catches. She has tenderness to palpation on the radial aspect of the wrist.  Pain is rated as 0/10 at rest but 5/10 with use. Worsened with movement, even worse when using her thumb.  Improves with rest Therapies tried include : she has had steroid injections in the past. She has tried taking Meloxicam with minimal relief Other associated symptoms include: Pain does radiate a couple of inches into the radial aspect of the forearm.  Shoulder pain has been present x several years but has gotten worse over the past 3 months or so. She has no known injury or trauma to the shoulder. She just moved to the area the end of June 2018. She relates some of her increased pain to this. Pain is mostly on the anterior and posterior aspect of the shoulder. The pain radiates about half way down the triceps. Pain is worse at night, she is unable to lay on her LT side without her shoulder aching and burning. Pain is worse with internal and external rotation and adduction. Pain worsens the higher she raises her arm. She reports crepitus. Pain improves with rest. She has taken Meloxicam with minimal relief. She has not tried using heat or ice on the area because the pain comes and goes.  No recent xray of the shoulder or wrist.   Notes from [place] on 10/29/17: Compared to the last office visit on 06/28/17, her  previously described L shoulder and wrist pain symptoms are worsening w/ increased pain in the L arm w/ N/T also noted in the L UE from the shoulder to the hand. Current symptoms are moderate & are radiating to the L UE. She has been taking Meloxicam daily. Would also like to talk about her L hip.  Prior hx of L hip ant/post labral repair (2016) - Dr. Dimas Chyle.  12/06/2017: Compared to the last office visit on 10/29/17, her previously described neck and L arm pain symptoms are improving.  She states that the neck pain hasn't really changed but notes that the L arm pain has improved.  She states that she con't to have N/T into her L arm. Current symptoms are moderate & are radiating into the L UE. She has been taking the Gabapentin and feels this helps. She is also taking Meloxicam daily.  Taking Zithromax for a UTI currently (day 3)  02/25/2018: Compared to the last office visit, her previously described symptoms are improving. She also has c/o multiple musculoskeletal aches and pains.  Current symptoms are moderate & are radiating to multiple areas including L arm. She has been taking Gabapentin and Meloxicam with some relief. She started following Nitro Protocol about 1 month ago and reports no trouble with HA. She feels that the Nitroglycerin patches are helping.  She c/o L-sided epicondylitis. She is having more trouble with pelvic movement. She has been  doing HEP intermittently but is doing more outdoor yard work than anything else. She has also started Yoga but her elbow pain makes this difficult. She tried usuing cervical traction pillow but found it to be more bothersome than beneficial.   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  She states that she is feeling more muscle pain today.  She notes that she feels the cervical OMT improves her symptoms for a few days and finds this treatment  beneficial. Current symptoms are moderate & are radiating to L arm. She has been taking Gabapentin and Meloxicam with some relief.  She has been following Nitro Protocol with L hip pain and feels this has been beneficial.   04/11/18: Compared to the last office visit on 03/11/2018, her previously described neck and radiating L arm pain symptoms show no change.  She states that she feels like her L shoulder is more problematic at this point and is wondering if there is something separately wrong vs just referred pain. Current symptoms are moderate & are radiating to L arm. She has been taking Gabapentin and Meloxicam.  She has attended 1 PT session and doesn't plan to continue.  She has been receiving OMT treatment w/ Dr. Paulla Fore.  She states that she didn't do well w/ the cervical traction pillow but has purchased a new heating pad and feels this helped. She has been following the nitro protocol for her L hip and feels this has been beneficial.  04/26/2018: Compared to the last office visit on 04/11/18, her previously described neck and radiating L arm pain symptoms are improving w/ improved cervical ROM noted. Current symptoms are moderate & are radiating to L upper arm but less than previously.  She notes that she has been experiencing decreased N/T as well in her L UE. She has been taking Gabapentin and Meloxicam.  She received the [employer] at her last visit but has not looked at these.  She was also given towel stretches for her neck and is doing these.  05/13/2018: Compared to the last office visit on 04/26/18, her previously described neck and L arm pain symptoms are improving overall.  She states that she's been trying to workout more at the gym. Current symptoms are mild & are radiating to L upper arm occasionally but much less than before. She has been taking Gabapentin and Meloxicam.  She has been doing her towel stretches.  She has tried the [employer]  some.  06/10/2018: Compared to the last office visit on 05/13/18, her previously described neck and L shoulder symptoms show no change since her last visit.  She states that she's had a very busy week this week w/ work. Current symptoms are mild & are radiating to the L upper arm She has been taking Gabapentin bid-tid and Meloxicam.  She has been doing her towel stretches.  She has done more of the [employer].  07/08/2018: Compared to the last office visit, her previously described symptoms are slightly worsening then at her last visit but still better than it was initially. Sx flare up when she doesn't get as much activity.  Current symptoms are mild-moderate & are radiating to the L shoulder but no longer down into the arm and hand.  She has not been doing exercises as often because she has been working more. She continues to take Gabapentin, Meloxicam and follow Nitro protocol. She denies HA when  using Nitroglycerin patches.   She c/o pain the L SI joint and pelvis.    REVIEW OF SYSTEMS: Reports night time disturbances.  Rarely. Denies fevers, chills, or night sweats. Denies unexplained weight loss. Denies personal history of cancer. Denies changes in bowel or bladder habits. Denies recent unreported falls. Denies new or worsening dyspnea or wheezing. Denies headaches or dizziness.  Denies numbness, tingling or weakness in the extremities. Denies dizziness or presyncopal episodes Denies lower extremity edema    HISTORY:  Prior history reviewed and updated per electronic medical record.  Social History   Occupational History  . Not on file  Tobacco Use  . Smoking status: Never Smoker  . Smokeless tobacco: Never Used  Substance and Sexual Activity  . Alcohol use: Yes    Comment: Social --1 per week  . Drug use: No  . Sexual activity: Yes    Birth control/protection: IUD    Comment: Mirena IUD inserted 02/2014   Social History   Social History Narrative  . Not on  file      DATA OBTAINED & REVIEWED:  No results for input(s): HGBA1C, LABURIC, CREATINE in the last 8760 hours. . S/p left hip arthroscopy with labral repair in 2015 .    OBJECTIVE:  VS:  HT:5\' 5"  (165.1 cm)   WT:196 lb 9.6 oz (89.2 kg)  BMI:32.72    BP:118/68  HR:96bpm  TEMP: ( )  RESP:97 %   PHYSICAL EXAM: CONSTITUTIONAL: Well-developed, Well-nourished and In no acute distress PSYCHIATRIC: Alert & appropriately interactive. and Not depressed or anxious appearing. RESPIRATORY: No increased work of breathing and Trachea Midline EYES: Pupils are equal., EOM intact without nystagmus. and No scleral icterus.  Upper and Lower extremities: EXTREMITY EXAM: Warm and well perfused NEURO: unremarkable Normal associated myotomal distribution strength to manual muscle testing Normal sensation to light touch Normal and symmetric associated DTRs  MSK Exam: Bilateral hip, cervical spine, lumbar spine  . Well aligned, no significant deformity. . No focal bony tenderness . No overlying skin changes.   RANGE OF MOTION & STRENGTH  . Normal, non-painful Internal and external range of motion of bilateral hips.  Good flexion extension of her back.   SPECIALITY TESTING:  Log Roll: normal, no pain FADIR: normal, no pain FABER: normal, no pain Stinchfield testing: normal, no pain Strength: Normal Axial loading produces: Mild pain and No crepitation        RIGHT    LEFT Straight leg raise-------------------------: normal, no pain                         normal, no pain       Neck:   NEURAL TENSION SIGNS Right Left  Brachial Plexus Squeeze: normal, no pain normal, no pain  Arm Squeeze Test: normal, no pain normal, no pain  Spurling's Compression Test: normal, no pain normal, no pain  Lhermitte's Compression test: Negative, no radiating pain      ASSESSMENT   1. Neck pain   2. Pain of left hip joint   3. Left hip flexor tightness   4. Somatic dysfunction of cervical  region   5. Somatic dysfunction of thoracic region   6. Somatic dysfunction of lumbar region   7. Somatic dysfunction of rib cage region   8. Somatic dysfunction of pelvis region     PROCEDURES  Osteopathic manipulation was performed today based on physical exam findings.  Please see procedure note for further information including Osteopathic Exam  findings  PLAN:   No orders of the defined types were placed in this encounter.   Responding well to OMT.  Could consider dry needling given tightness.  Will place referall to PT  Continue previously prescribed home exercise program.   Discussed red flag symptoms that warrant earlier emergent evaluation and patient voices understanding.  Activity modifications and the importance of avoiding exacerbating activities (limiting pain to no more than a 4 / 10 during or following activity) recommended and discussed.  Follow-up: Return in about 3 weeks (around 07/29/2018) for consideration of repeat Osteopathic Manipulation.   If any lack of improvement consider further diagnostic evaluation with Repeat imaging of the lumbar and/or cervical spine.     CMA/ATC served as Education administrator during this visit. History, Physical, and Plan performed by medical provider.  Documentation and orders reviewed and attested to.      Gerda Diss, [facility] Physician

## 2018-07-13 ENCOUNTER — Ambulatory Visit: Payer: BC Managed Care – PPO | Admitting: Physical Therapy

## 2018-07-13 ENCOUNTER — Encounter: Payer: Self-pay | Admitting: Physical Therapy

## 2018-07-13 DIAGNOSIS — G8929 Other chronic pain: Secondary | ICD-10-CM

## 2018-07-13 DIAGNOSIS — M25552 Pain in left hip: Secondary | ICD-10-CM

## 2018-07-13 DIAGNOSIS — M545 Low back pain, unspecified: Secondary | ICD-10-CM

## 2018-07-13 DIAGNOSIS — M542 Cervicalgia: Secondary | ICD-10-CM

## 2018-07-14 ENCOUNTER — Encounter: Payer: Self-pay | Admitting: Physical Therapy

## 2018-07-14 NOTE — Therapy (Signed)
[facility] [address] [place], [place], [address] Phone: [phone]   Fax:  [phone]  Physical Therapy Evaluation  Patient Details  Name: Jill Frank MRN: [id number] Date of Birth: [birth year] Referring Provider (PT): Teresa Coombs   Encounter Date: 07/13/2018  PT End of Session - 07/14/18 1243    Visit Number  1    Number of Visits  12    Date for PT Re-Evaluation  08/24/18    Authorization Type  [employer]     PT Start Time  1515    PT Stop Time  1602    PT Time Calculation (min)  47 min    Activity Tolerance  Patient tolerated treatment well    Behavior During Therapy  [facility] for tasks assessed/performed       Past Medical History:  Diagnosis Date  . Abnormal Pap smear of cervix 05/2013   --had colposcopy for LGSIL but no treatment to cervix  . ADHD   . Anxiety   . Arthritis   . 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 Depression   . Fibroadenoma    Rt.Breast  . Hyperhidrosis 06/23/2017  . Hyperhidrosis   . Hypertension   . Melasma   . Obstructive sleep apnea   . Piriformis syndrome   . PTSD (post-traumatic stress disorder)   . Sacroiliitis ([place])   . Seasonal allergic rhinitis due to pollen 06/23/2017  . Vitamin D deficiency     Past Surgical History:  Procedure Laterality Date  . BREAST BIOPSY Right    benign  . BREAST SURGERY     breast biopsy  . COLPOSCOPY  05/2013   LGSIL--no treatment  . excision of melanoma  1998   --back  . HIP ARTHROSCOPY W/ LABRAL REPAIR Left 11/2013  . KNEE ARTHROSCOPY  02/2013    There were no vitals filed for this visit.   Subjective Assessment - 07/13/18 1522    Subjective   Pt states Increased pain in neck and L hip.  She has bil neck pain, unable to pinpoint certain spot of pain, but feels tightness "all over". She did previously have L radiculopathy, mostly gone now. She was seen for 1 PT visit in June for this.   She also has L hip/back pain, she has been getting adjustments for,  which help. She states previous hip labral repair surgery years ago. She works as Tour manager, teaches at [employer].  Pt states increased pain in hip that happens all of the sudden. She has not continued to do strengthening exercises much.     Limitations  Sitting;Lifting;Standing;Walking;Writing;House hold activities    Patient Stated Goals  Decreased pain in L hip and neck region.     Currently in Pain?  Yes    Pain Score  3     Pain Location  Neck    Pain Orientation  Left    Pain Descriptors / Indicators  Aching    Pain Type  Chronic pain    Pain Onset  More than a month ago    Pain Frequency  Intermittent    Multiple Pain Sites  Yes    Pain Score  2    Pain Location  Hip    Pain Orientation  Left    Pain Descriptors / Indicators  Tightness    Pain Type  Chronic pain    Pain Onset  More than a month ago    Pain Frequency  Intermittent  [facility] PT Assessment - 07/14/18 0001      Assessment   Medical Diagnosis  L hip pain/ Cervical pain    Referring Provider (PT)  Teresa Coombs    Hand Dominance  Right    Prior Therapy  1 visit/eval      Precautions   Precautions  None      Balance Screen   Has the patient fallen in the past 6 months  No      Prior Function   Level of Independence  Independent      Cognition   Overall Cognitive Status  Within Functional Limits for tasks assessed      AROM   Overall AROM Comments  Cervical: mild/moderate deficits for rotation, minimal for flex/ext;   L hip: mild defiict for flex/ext, moderate for IR/ER;       Strength   Overall Strength Comments  Hip: 4-/5;  Core: 3/5;   Scapular: 4-/5       Palpation   Palpation comment  Tenderness and trigger points in Bil UT, levator, cervical paraspinals and sub occipitals;    Pain/tenderness in L glute, glute med, and pifirformis;       Special Tests   Other special tests  Neg SLR, Neg Faber,                 Objective measurements completed on examination: See above  findings.      [facility] Adult PT Treatment/Exercise - 07/14/18 0001      Manual Therapy   Manual Therapy  Soft tissue mobilization    Manual therapy comments  Skilled palpation and monitoring of soft tissue during dry needling;     Soft tissue mobilization  Manual trigger point release to Bil UT;        Trigger Point Dry Needling - 07/14/18 1248    Consent Given?  Yes    Education Handout Provided  Yes    Muscles Treated Upper Body  Upper trapezius    Upper Trapezius Response  Twitch reponse elicited;Palpable increased muscle length   bilateral          PT Education - 07/14/18 1243    Education Details  PT POC, Dry needling expectations, precautions, benefits.     Person(s) Educated  Patient    Methods  Explanation    Comprehension  Verbalized understanding       PT Short Term Goals - 07/14/18 1250      PT SHORT TERM GOAL #1   Title  Pt to report decreased pain in neck region to 3/10     Time  2    Period  Weeks    Status  New    Target Date  07/27/18      PT SHORT TERM GOAL #2   Title  Pt to be independent with initial HEP     Time  2    Period  Weeks    Status  New    Target Date  07/27/18        PT Long Term Goals - 07/14/18 1406      PT LONG TERM GOAL #1   Title  Pt to report decreased pain in cervical region to 0-2/10 with activity and work duties.     Time  6    Period  Weeks    Status  New    Target Date  08/24/18      PT LONG TERM GOAL #2   Title  Pt to report decreased  pain in L hip/SI, to 0-2/10 to improve ability for IADLS and work duties.     Time  6    Period  Weeks    Status  New    Target Date  08/24/18      PT LONG TERM GOAL #3   Title  Pt to demo cervical ROM to be WNL and pain free, to improve ability for driving and work duties.     Time  6    Period  Weeks    Status  New    Target Date  08/24/18      PT LONG TERM GOAL #4   Title  Pt to be independent with HEP for postural strengtening and for lumbar/hip stabilization;      Time  6    Period  Weeks    Status  New    Target Date  08/24/18             Plan - 07/14/18 1410    Clinical Impression Statement  Pt presents with primary complaint of increased pain in L hip and cervical region. She has mild ROM loss in neck, due to muscle tightness. She has tenderness and trigger points in bil upper trap, levator region, and tightness in sub occipitlas and cervical paraspinals. Pt with decreased strength of scapular/postural muscles and will benefit from education on HEP for this. Pt also with pain in L lateral hip, glute and SI region. She has trigger points and tenderness in L glute, glute med and piriformis. Pt with instability of bil hips and core, and will benefit from education on HEp for this. Pt with decreased ability for full functional activities due to pain and deificts. Plan to see pt for both diagnosis. Pt to benefit from skilled PT to improve deficts and pain, as well as to teach HEP.      Clinical Presentation  Stable    Clinical Decision Making  Low    Rehab Potential  Good    PT Frequency  2x / week    PT Duration  6 weeks    PT Treatment/Interventions  ADLs/Self Care Home Management;Canalith Repostioning;Cryotherapy;Electrical Stimulation;Iontophoresis 4mg /ml Dexamethasone;Moist Heat;Therapeutic activities;Functional mobility training;Stair training;Gait training;Ultrasound;Therapeutic exercise;Balance training;Neuromuscular re-education;Patient/family education;Dry needling;Passive range of motion;Manual techniques;Taping    Consulted and Agree with Plan of Care  Patient       Patient will benefit from skilled therapeutic intervention in order to improve the following deficits and impairments:  Pain, Improper body mechanics, Increased muscle spasms, Decreased mobility, Decreased activity tolerance, Decreased range of motion, Decreased strength, Impaired flexibility, Abnormal gait  Visit Diagnosis: Cervicalgia  Pain in left hip  Chronic  left-sided low back pain without sciatica     Problem List Patient Active Problem List   Diagnosis Date Noted  . B12 deficiency 07/08/2018  . IUD (intrauterine device) in place 12/19/2017  . Greater trochanteric pain syndrome of left lower extremity 10/29/2017  . Osteoarthritis of spine with radiculopathy, cervical region 10/29/2017  . ADHD, on Vyvanse, followed by Dr. Toy Care 08/02/2017  . OSA (obstructive sleep apnea) 07/21/2017  . Polyarthralgia 06/28/2017  . Left wrist pain 06/28/2017  . Seasonal allergic rhinitis due to pollen, on Singulair, Flonase, and Astelin 06/23/2017  . Hyperhidrosis 06/23/2017  . Hypertension, on Benicar and Hytrin   . Depression     Lyndee Hensen, PT, DPT 3:35 PM  07/14/18    [place], [place], [address] Phone: [phone]   Fax:  [phone]  Name: Keliah Harned MRN: [id number] Date of Birth: 1976-05-30

## 2018-07-15 ENCOUNTER — Ambulatory Visit: Payer: BC Managed Care – PPO | Admitting: Family Medicine

## 2018-07-19 ENCOUNTER — Encounter: Payer: Self-pay | Admitting: Family Medicine

## 2018-07-19 ENCOUNTER — Ambulatory Visit: Payer: BC Managed Care – PPO | Admitting: Family Medicine

## 2018-07-19 ENCOUNTER — Ambulatory Visit (INDEPENDENT_AMBULATORY_CARE_PROVIDER_SITE_OTHER)
Admission: RE | Admit: 2018-07-19 | Discharge: 2018-07-19 | Disposition: A | Discharge status: Home / Self Care | Payer: BC Managed Care – PPO | Source: Ambulatory Visit | Attending: Family Medicine | Admitting: Family Medicine

## 2018-07-19 VITALS — BP 116/72 | HR 93 | Temp 98.2°F | Ht 65.0 in | Wt 197.8 lb

## 2018-07-19 DIAGNOSIS — R29818 Other symptoms and signs involving the nervous system: Secondary | ICD-10-CM | POA: Diagnosis not present

## 2018-07-19 DIAGNOSIS — L918 Other hypertrophic disorders of the skin: Secondary | ICD-10-CM

## 2018-07-19 DIAGNOSIS — Q670 Congenital facial asymmetry: Secondary | ICD-10-CM | POA: Diagnosis not present

## 2018-07-19 MED ORDER — IOPAMIDOL (ISOVUE-300) INJECTION 61%
80.0000 mL | Freq: Once | INTRAVENOUS | Status: AC | PRN
  Administered 2018-07-19: 80 mL via INTRAVENOUS

## 2018-07-19 NOTE — Progress Notes (Signed)
Jill Frank is a 42 y.o. female is here for follow up.  History of Present Illness:   HPI:   1. Facial asymmetry. Right acute onset, but has lasted for months now and is worsening. Worse lower face, with decreased strength. On daily Valtrex. No HA, dizziness, vision changes, trouble speaking or swallowing. No Hx of the same.     2. Inflamed skin tag. X 3, groin. Irritated.    There are no preventive care reminders to display for this patient.   Depression screen [facility] 2/9 02/25/2018 06/21/2017  Decreased Interest 3 2  Down, Depressed, Hopeless 1 1  PHQ - 2 Score 4 3  Altered sleeping 1 2  Tired, decreased energy 3 3  Change in appetite 1 2  Feeling bad or failure about yourself  1 1  Trouble concentrating 1 1  Moving slowly or fidgety/restless 0 1  Suicidal thoughts 0 0  PHQ-9 Score 11 13  Difficult doing work/chores Somewhat difficult -   PMHx, SurgHx, SocialHx, FamHx, Medications, and Allergies were reviewed in the Visit Navigator and updated as appropriate.   Patient Active Problem List   Diagnosis Date Noted  . B12 deficiency 07/08/2018  . IUD (intrauterine device) in place 12/19/2017  . Greater trochanteric pain syndrome of left lower extremity 10/29/2017  . Osteoarthritis of spine with radiculopathy, cervical region 10/29/2017  . ADHD, on Vyvanse, followed by Dr. Toy Care 08/02/2017  . OSA (obstructive sleep apnea) 07/21/2017  . Polyarthralgia 06/28/2017  . Left wrist pain 06/28/2017  . Seasonal allergic rhinitis due to pollen, on Singulair, Flonase, and Astelin 06/23/2017  . Hyperhidrosis 06/23/2017  . Hypertension, on Benicar and Hytrin   . Depression    Social History   Tobacco Use  . Smoking status: Never Smoker  . Smokeless tobacco: Never Used  Substance Use Topics  . Alcohol use: Yes    Comment: Social --1 per week  . Drug use: No   Current Medications and Allergies:   .  azelastine (ASTELIN) 0.1 % nasal spray, Place 1 spray into both nostrils daily.  Use in each nostril as directed, Disp: 30 mL, Rfl: 3 .  buPROPion (WELLBUTRIN XL) 150 MG 24 hr tablet, Take 1 tablet (150 mg total) by mouth 3 (three) times daily., Disp: 270 tablet, Rfl: 1 .  clonazePAM (KLONOPIN) 0.5 MG tablet, Take 1 tablet (0.5 mg total) by mouth 2 (two) times daily as needed for anxiety. .  Cyanocobalamin (VITAMIN B 12 PO), Take by mouth daily. , Disp: , Rfl:  .  DULoxetine (CYMBALTA) 30 MG capsule, TAKE 1 CAPSULE 3 TIMES A   DAY, Disp: 270 capsule, Rfl: 1 .  fluticasone (FLONASE) 50 MCG/ACT nasal spray, Place 1 spray into both nostrils daily., Disp: 16 g, Rfl: 2 .  gabapentin (NEURONTIN) 300 MG capsule, TAKE 1 CAPSULE 3 TIMES     DAILY AS NEEDED, Disp: 270 capsule, Rfl: 1 .  meloxicam (MOBIC) 15 MG tablet, TAKE 1 TABLET DAILY., Disp: 90 tablet, Rfl: 1 .  montelukast (SINGULAIR) 10 MG tablet, TAKE 1 TABLET AT BEDTIME, Disp: 90 tablet, Rfl: 1 .  nitroGLYCERIN (NITRODUR - DOSED IN MG/24 HR) 0.2 mg/hr patch, Place 1/4 to 1/2 of a patch over affected region. Remove and replace once daily.  Slightly alter skin placement daily, Disp: 90 patch, Rfl: 0 .  olmesartan (BENICAR) 40 MG tablet, Take 1 tablet (40 mg total) by mouth daily., Disp: 90 tablet, Rfl: 1 .  terazosin (HYTRIN) 10 MG capsule, TAKE 1 CAPSULE  AT BEDTIME, Disp: 90 capsule, Rfl: 1 .  valACYclovir (VALTREX) 500 MG tablet, TAKE 1 TABLET DAILY, Disp: 90 tablet, Rfl: 1 .  VYVANSE 70 MG capsule, Take 1 capsule by mouth daily., Disp: , Rfl: 0   Allergies  Allergen Reactions  . Doxycycline Hives  . Erythromycin Hives  . Penicillins Hives   Review of Systems   Pertinent items are noted in the HPI. Otherwise, ROS is negative.  Vitals:   Vitals:   07/19/18 0702  BP: 116/72  Pulse: 93  Temp: 98.2 F (36.8 C)  TempSrc: Oral  SpO2: 96%  Weight: 197 lb 12.8 oz (89.7 kg)  Height: 5\' 5"  (1.651 m)     Body mass index is 32.92 kg/m.  Physical Exam:   Physical Exam  Constitutional: She appears well-nourished.    HENT:  Head: Normocephalic and atraumatic.  Decreased right nasolabial fold, droop of right lateral lip. Decreased strength right face.  Eyes: Pupils are equal, round, and reactive to light. EOM are normal.  Neck: Normal range of motion. Neck supple.  Cardiovascular: Normal rate, regular rhythm, normal heart sounds and intact distal pulses.  Pulmonary/Chest: Effort normal.  Abdominal: Soft.  Skin: Skin is warm.  Three irritated skin tags intertriginous folds groin.  Psychiatric: She has a normal mood and affect. Her behavior is normal.  Nursing note and vitals reviewed.   Assessment and Plan:   Diagnoses and all orders for this visit:  Facial asymmetry -     CT Head Wo Contrast  Focal neurological deficit -     CT Head Wo Contrast  Inflamed skin tag    . Reviewed expectations re: course of current medical issues. . Discussed self-management of symptoms. . Outlined signs and symptoms indicating need for more acute intervention. . Patient verbalized understanding and all questions were answered. Marland Kitchen Health Maintenance issues including appropriate healthy diet, exercise, and smoking avoidance were discussed with patient. . See orders for this visit as documented in the electronic medical record. . Patient received an After Visit Summary.  Briscoe Deutscher, DO [place], [facility] 07/19/2018

## 2018-07-20 ENCOUNTER — Ambulatory Visit: Payer: BC Managed Care – PPO | Admitting: Physical Therapy

## 2018-07-20 ENCOUNTER — Ambulatory Visit: Payer: BC Managed Care – PPO | Admitting: Sports Medicine

## 2018-07-20 ENCOUNTER — Other Ambulatory Visit: Payer: Self-pay

## 2018-07-20 DIAGNOSIS — M545 Low back pain: Secondary | ICD-10-CM

## 2018-07-20 DIAGNOSIS — M542 Cervicalgia: Secondary | ICD-10-CM | POA: Diagnosis not present

## 2018-07-20 DIAGNOSIS — M25552 Pain in left hip: Secondary | ICD-10-CM

## 2018-07-20 DIAGNOSIS — G8929 Other chronic pain: Secondary | ICD-10-CM | POA: Diagnosis not present

## 2018-07-20 DIAGNOSIS — R29818 Other symptoms and signs involving the nervous system: Secondary | ICD-10-CM

## 2018-07-22 ENCOUNTER — Encounter: Payer: Self-pay | Admitting: Neurology

## 2018-07-24 ENCOUNTER — Encounter: Payer: Self-pay | Admitting: Physical Therapy

## 2018-07-24 NOTE — Therapy (Signed)
[facility] [address] [place], [place], [address] Phone: [phone]   Fax:  [phone]  Physical Therapy Treatment  Patient Details  Name: Jill Frank MRN: [id number] Date of Birth: [birth year] Referring Provider (PT): Teresa Coombs   Encounter Date: 07/20/2018  PT End of Session - 07/24/18 1420    Visit Number  2    Number of Visits  12    Date for PT Re-Evaluation  08/24/18    Authorization Type  [employer]     PT Start Time  1346    PT Stop Time  1432    PT Time Calculation (min)  46 min    Activity Tolerance  Patient tolerated treatment well    Behavior During Therapy  [facility] for tasks assessed/performed       Past Medical History:  Diagnosis Date  . Abnormal Pap smear of cervix 05/2013   --had colposcopy for LGSIL but no treatment to cervix  . ADHD   . Anxiety   . Arthritis   . 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 Depression   . Fibroadenoma    Rt.Breast  . Hyperhidrosis 06/23/2017  . Hyperhidrosis   . Hypertension   . Melasma   . Obstructive sleep apnea   . Piriformis syndrome   . PTSD (post-traumatic stress disorder)   . Sacroiliitis ([place])   . Seasonal allergic rhinitis due to pollen 06/23/2017  . Vitamin D deficiency     Past Surgical History:  Procedure Laterality Date  . BREAST BIOPSY Right    benign  . BREAST SURGERY     breast biopsy  . COLPOSCOPY  05/2013   LGSIL--no treatment  . excision of melanoma  1998   --back  . HIP ARTHROSCOPY W/ LABRAL REPAIR Left 11/2013  . KNEE ARTHROSCOPY  02/2013    There were no vitals filed for this visit.  Subjective Assessment - 07/24/18 1419    Subjective  Pt states miimal sorness after last visit. She states tightness in upper trap region, and tightness/soreness in L glute region.     Patient Stated Goals  Decreased pain in L hip and neck region.     Currently in Pain?  Yes    Pain Score  3     Pain Location  Neck    Pain Orientation  Left    Pain Descriptors /  Indicators  Aching    Pain Type  Chronic pain    Pain Onset  More than a month ago    Pain Frequency  Intermittent    Multiple Pain Sites  Yes    Pain Score  2    Pain Location  Hip    Pain Orientation  Left    Pain Descriptors / Indicators  Tightness    Pain Type  Chronic pain    Pain Onset  More than a month ago    Pain Frequency  Intermittent                       [facility] Adult PT Treatment/Exercise - 07/24/18 0001      Exercises   Exercises  Shoulder      Shoulder Exercises: Seated   Other Seated Exercises  Chin tucks x20;       Shoulder Exercises: Standing   External Rotation  20 reps    Theraband Level (Shoulder External Rotation)  Level 2 (Red)    External Rotation Limitations  Bil ER;  Row  20 reps    Theraband Level (Shoulder Row)  Level 3 (Green)    Other Standing Exercises  Horizontal Abd/Scap pull outs RTB x20;       Shoulder Exercises: Stretch   Corner Stretch  3 reps;30 seconds      Manual Therapy   Manual Therapy  Soft tissue mobilization    Manual therapy comments  Skilled palpation and monitoring of soft tissue during dry needling;     Soft tissue mobilization  STM to Bil UT, sub occipitals: manual cervical distraction 10 sec x10;        Trigger Point Dry Needling - 07/24/18 1417    Consent Given?  Yes    Muscles Treated Upper Body  Upper trapezius;Levator scapulae    Muscles Treated Lower Body  Gluteus minimus;Gluteus maximus;Piriformis   Deep hip rotators:    Upper Trapezius Response  Twitch reponse elicited;Palpable increased muscle length    Levator Scapulae Response  Twitch response elicited;Palpable increased muscle length    Gluteus Maximus Response  Palpable increased muscle length    Gluteus Minimus Response  Palpable increased muscle length    Piriformis Response  Twitch response elicited;Palpable increased muscle length             PT Short Term Goals - 07/14/18 1250      PT SHORT TERM GOAL #1   Title  Pt to  report decreased pain in neck region to 3/10     Time  2    Period  Weeks    Status  New    Target Date  07/27/18      PT SHORT TERM GOAL #2   Title  Pt to be independent with initial HEP     Time  2    Period  Weeks    Status  New    Target Date  07/27/18        PT Long Term Goals - 07/14/18 1406      PT LONG TERM GOAL #1   Title  Pt to report decreased pain in cervical region to 0-2/10 with activity and work duties.     Time  6    Period  Weeks    Status  New    Target Date  08/24/18      PT LONG TERM GOAL #2   Title  Pt to report decreased pain in L hip/SI, to 0-2/10 to improve ability for IADLS and work duties.     Time  6    Period  Weeks    Status  New    Target Date  08/24/18      PT LONG TERM GOAL #3   Title  Pt to demo cervical ROM to be WNL and pain free, to improve ability for driving and work duties.     Time  6    Period  Weeks    Status  New    Target Date  08/24/18      PT LONG TERM GOAL #4   Title  Pt to be independent with HEP for postural strengtening and for lumbar/hip stabilization;     Time  6    Period  Weeks    Status  New    Target Date  08/24/18            Plan - 07/24/18 1422    Clinical Impression Statement  Tightness and trigger points in neck and L glute addressed with manual therapy and dry needling today.  Pt with good response, will continue to assess next visit. Pt to benefit from continued education on postural strengthening and pec stretching.     Rehab Potential  Good    PT Frequency  2x / week    PT Duration  6 weeks    PT Treatment/Interventions  ADLs/Self Care Home Management;Canalith Repostioning;Cryotherapy;Electrical Stimulation;Iontophoresis 4mg /ml Dexamethasone;Moist Heat;Therapeutic activities;Functional mobility training;Stair training;Gait training;Ultrasound;Therapeutic exercise;Balance training;Neuromuscular re-education;Patient/family education;Dry needling;Passive range of motion;Manual techniques;Taping     Consulted and Agree with Plan of Care  Patient       Patient will benefit from skilled therapeutic intervention in order to improve the following deficits and impairments:  Pain, Improper body mechanics, Increased muscle spasms, Decreased mobility, Decreased activity tolerance, Decreased range of motion, Decreased strength, Impaired flexibility, Abnormal gait  Visit Diagnosis: Cervicalgia  Pain in left hip  Chronic left-sided low back pain without sciatica     Problem List Patient Active Problem List   Diagnosis Date Noted  . B12 deficiency 07/08/2018  . IUD (intrauterine device) in place 12/19/2017  . Greater trochanteric pain syndrome of left lower extremity 10/29/2017  . Osteoarthritis of spine with radiculopathy, cervical region 10/29/2017  . ADHD, on Vyvanse, followed by Dr. Toy Care 08/02/2017  . OSA (obstructive sleep apnea) 07/21/2017  . Polyarthralgia 06/28/2017  . Left wrist pain 06/28/2017  . Seasonal allergic rhinitis due to pollen, on Singulair, Flonase, and Astelin 06/23/2017  . Hyperhidrosis 06/23/2017  . Hypertension, on Benicar and Hytrin   . Depression     Lyndee Hensen, PT, DPT 2:23 PM  07/24/18    [facility] [place], [place], [address] Phone: [phone]   Fax:  [phone]  Name: Jill Frank MRN: [id number] Date of Birth: April 13, 1976

## 2018-07-26 ENCOUNTER — Encounter: Payer: BC Managed Care – PPO | Admitting: Physical Therapy

## 2018-08-03 NOTE — Progress Notes (Deleted)
NEUROLOGY CONSULTATION NOTE  Jill Frank MRN: [id number] DOB: July 18, 1976  Referring provider: Briscoe Deutscher, DO Primary care provider: Briscoe Deutscher, DO  Reason for consult:  Facial asymmetry  HISTORY OF PRESENT ILLNESS: Jill Frank is a 42 year old ***-handed female with hypertension, hyperhidrosis, PTSD, ADHD, and arthritis who presents for facial asymmetry.  History supplemented by referring providers note.  ***, she developed acute onset of right facial ***.  There is no associated headache, visual disturbance, slurred speech, difficulty swallowing, dizziness, unsteady gait, or unilateral numbness or weakness of the extremities.  PAST MEDICAL HISTORY: Past Medical History:  Diagnosis Date  . Abnormal Pap smear of cervix 05/2013   --had colposcopy for LGSIL but no treatment to cervix  . ADHD   . Anxiety   . Arthritis   . Cancer ([place]) 1998   back  . Depression   . Fibroadenoma    Rt.Breast  . Hyperhidrosis 06/23/2017  . Hyperhidrosis   . Hypertension   . Melasma   . Obstructive sleep apnea   . Piriformis syndrome   . PTSD (post-traumatic stress disorder)   . Sacroiliitis ([place])   . Seasonal allergic rhinitis due to pollen 06/23/2017  . Vitamin D deficiency     PAST SURGICAL HISTORY: Past Surgical History:  Procedure Laterality Date  . BREAST BIOPSY Right    benign  . BREAST SURGERY     breast biopsy  . COLPOSCOPY  05/2013   LGSIL--no treatment  . excision of melanoma  1998   --back  . HIP ARTHROSCOPY W/ LABRAL REPAIR Left 11/2013  . KNEE ARTHROSCOPY  02/2013    MEDICATIONS: Current Outpatient Medications on File Prior to Visit  Medication Sig Dispense Refill  . ALPRAZolam (XANAX) 0.5 MG tablet Take 0.5 mg by mouth as needed for anxiety (For air travel.).    Marland Kitchen azelastine (ASTELIN) 0.1 % nasal spray Place 1 spray into both nostrils daily. Use in each nostril as directed 30 mL 3  . buPROPion (WELLBUTRIN XL) 150 MG 24 hr tablet Take 1 tablet (150 mg  total) by mouth 3 (three) times daily. 270 tablet 1  . clonazePAM (KLONOPIN) 0.5 MG tablet Take 1 tablet (0.5 mg total) by mouth 2 (two) times daily as needed for anxiety. 180 tablet 0  . Cyanocobalamin (VITAMIN B 12 PO) Take by mouth daily.     . DULoxetine (CYMBALTA) 30 MG capsule TAKE 1 CAPSULE 3 TIMES A   DAY 270 capsule 1  . Fluocin-Hydroquinone-Tretinoin (TRI-LUMA) 0.01-4-0.05 % CREA Apply daily as needed. 90 g 1  . fluticasone (FLONASE) 50 MCG/ACT nasal spray Place 1 spray into both nostrils daily. 16 g 2  . gabapentin (NEURONTIN) 300 MG capsule TAKE 1 CAPSULE 3 TIMES     DAILY AS NEEDED 270 capsule 1  . levonorgestrel (MIRENA) 20 MCG/24HR IUD 1 each by Intrauterine route once.    . meloxicam (MOBIC) 15 MG tablet TAKE 1 TABLET DAILY. 90 tablet 1  . montelukast (SINGULAIR) 10 MG tablet TAKE 1 TABLET AT BEDTIME 90 tablet 1  . Multiple Vitamins-Minerals (VITAMIN D3 COMPLETE PO) Take by mouth.    . nitroGLYCERIN (NITRODUR - DOSED IN MG/24 HR) 0.2 mg/hr patch Place 1/4 to 1/2 of a patch over affected region. Remove and replace once daily.  Slightly alter skin placement daily 90 patch 0  . olmesartan (BENICAR) 40 MG tablet Take 1 tablet (40 mg total) by mouth daily. 90 tablet 1  . terazosin (HYTRIN) 10 MG capsule TAKE 1 CAPSULE AT  BEDTIME 90 capsule 1  . valACYclovir (VALTREX) 500 MG tablet TAKE 1 TABLET DAILY 90 tablet 1  . VYVANSE 70 MG capsule Take 1 capsule by mouth daily.  0   No current facility-administered medications on file prior to visit.     ALLERGIES: Allergies  Allergen Reactions  . Doxycycline Hives  . Erythromycin Hives  . Penicillins Hives    FAMILY HISTORY: Family History  Problem Relation Age of Onset  . Breast cancer Maternal Aunt 63       A & W  . Breast cancer Maternal Grandmother 45       mets to brain  . Autoimmune disease Mother   . Anxiety disorder Father   . CAD Father   . Other Father        dyslipidemia  . Bipolar disorder Sister   . ADD / ADHD  Sister   . Thyroid disease Maternal Grandfather   . Cancer Paternal Grandfather 64       dec colon ca   ***.  SOCIAL HISTORY: Social History   Socioeconomic History  . Marital status: Single    Spouse name: Not on file  . Number of children: Not on file  . Years of education: Not on file  . Highest education level: Not on file  Occupational History  . Not on file  Social Needs  . Financial resource strain: Not on file  . Food insecurity:    Worry: Not on file    Inability: Not on file  . Transportation needs:    Medical: Not on file    Non-medical: Not on file  Tobacco Use  . Smoking status: Never Smoker  . Smokeless tobacco: Never Used  Substance and Sexual Activity  . Alcohol use: Yes    Comment: Social --1 per week  . Drug use: No  . Sexual activity: Yes    Birth control/protection: IUD    Comment: Mirena IUD inserted 02/2014  Lifestyle  . Physical activity:    Days per week: Not on file    Minutes per session: Not on file  . Stress: Not on file  Relationships  . Social connections:    Talks on phone: Not on file    Gets together: Not on file    Attends religious service: Not on file    Active member of club or organization: Not on file    Attends meetings of clubs or organizations: Not on file    Relationship status: Not on file  . Intimate partner violence:    Fear of current or ex partner: Not on file    Emotionally abused: Not on file    Physically abused: Not on file    Forced sexual activity: Not on file  Other Topics Concern  . Not on file  Social History Narrative  . Not on file    REVIEW OF SYSTEMS: Constitutional: No fevers, chills, or sweats, no generalized fatigue, change in appetite Eyes: No visual changes, double vision, eye pain Ear, nose and throat: No hearing loss, ear pain, nasal congestion, sore throat Cardiovascular: No chest pain, palpitations Respiratory:  No shortness of breath at rest or with exertion,  wheezes GastrointestinaI: No nausea, vomiting, diarrhea, abdominal pain, fecal incontinence Genitourinary:  No dysuria, urinary retention or frequency Musculoskeletal:  No neck pain, back pain Integumentary: No rash, pruritus, skin lesions Neurological: as above Psychiatric: No depression, insomnia, anxiety Endocrine: No palpitations, fatigue, diaphoresis, mood swings, change in appetite, change in weight, increased thirst Hematologic/Lymphatic:  No purpura, petechiae. Allergic/Immunologic: no itchy/runny eyes, nasal congestion, recent allergic reactions, rashes  PHYSICAL EXAM: *** General: No acute distress.  Patient appears ***-groomed.  *** Head:  Normocephalic/atraumatic Eyes:  fundi examined but not visualized Neck: supple, no paraspinal tenderness, full range of motion Back: No paraspinal tenderness Heart: regular rate and rhythm Lungs: Clear to auscultation bilaterally. Vascular: No carotid bruits. Neurological Exam: Mental status: alert and oriented to person, place, and time, recent and remote memory intact, fund of knowledge intact, attention and concentration intact, speech fluent and not dysarthric, language intact. Cranial nerves: CN I: not tested CN II: pupils equal, round and reactive to light, visual fields intact CN III, IV, VI:  full range of motion, no nystagmus, no ptosis CN V: facial sensation intact CN VII: upper and lower face symmetric CN VIII: hearing intact CN IX, X: gag intact, uvula midline CN XI: sternocleidomastoid and trapezius muscles intact CN XII: tongue midline Bulk & Tone: normal, no fasciculations. Motor:  5/5 throughout *** Sensation:  Pinprick *** temperature *** and vibration sensation intact.  ***. Deep Tendon Reflexes:  2+ throughout, *** toes downgoing.  *** Finger to nose testing:  Without dysmetria.  *** Heel to shin:  Without dysmetria.  *** Gait:  Normal station and stride.  Able to turn and tandem walk. Romberg  ***.  IMPRESSION: ***  PLAN: ***  Thank you for allowing me to take part in the care of this patient.  Metta Clines, DO  CC: ***

## 2018-08-04 ENCOUNTER — Ambulatory Visit: Payer: BC Managed Care – PPO | Admitting: Neurology

## 2018-08-05 ENCOUNTER — Encounter: Payer: Self-pay | Admitting: Physical Therapy

## 2018-08-05 ENCOUNTER — Encounter: Payer: BC Managed Care – PPO | Admitting: Physical Therapy

## 2018-08-05 ENCOUNTER — Ambulatory Visit: Payer: BC Managed Care – PPO | Admitting: Physical Therapy

## 2018-08-05 DIAGNOSIS — M542 Cervicalgia: Secondary | ICD-10-CM | POA: Diagnosis not present

## 2018-08-05 DIAGNOSIS — M25552 Pain in left hip: Secondary | ICD-10-CM

## 2018-08-05 DIAGNOSIS — G8929 Other chronic pain: Secondary | ICD-10-CM | POA: Diagnosis not present

## 2018-08-05 DIAGNOSIS — M545 Low back pain: Secondary | ICD-10-CM

## 2018-08-05 NOTE — Therapy (Addendum)
[facility] [address] [place], [place], [address] Phone: [phone]   Fax:  [phone]  Physical Therapy Treatment  Patient Details  Name: Jill Frank MRN: [id number] Date of Birth: 01-26-1976 Referring Provider (PT): Teresa Coombs   Encounter Date: 08/05/2018  PT End of Session - 08/05/18 1024    Visit Number  3    Number of Visits  12    Date for PT Re-Evaluation  08/24/18    Authorization Type  [employer]     PT Start Time  1020    PT Stop Time  1101    PT Time Calculation (min)  41 min    Activity Tolerance  Patient tolerated treatment well    Behavior During Therapy  [facility] for tasks assessed/performed       Past Medical History:  Diagnosis Date  . Abnormal Pap smear of cervix 05/2013   --had colposcopy for LGSIL but no treatment to cervix  . ADHD   . Anxiety   . Arthritis   . 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 Depression   . Fibroadenoma    Rt.Breast  . Hyperhidrosis 06/23/2017  . Hyperhidrosis   . Hypertension   . Melasma   . Obstructive sleep apnea   . Piriformis syndrome   . PTSD (post-traumatic stress disorder)   . Sacroiliitis ([place])   . Seasonal allergic rhinitis due to pollen 06/23/2017  . Vitamin D deficiency     Past Surgical History:  Procedure Laterality Date  . BREAST BIOPSY Right    benign  . BREAST SURGERY     breast biopsy  . COLPOSCOPY  05/2013   LGSIL--no treatment  . excision of melanoma  1998   --back  . HIP ARTHROSCOPY W/ LABRAL REPAIR Left 11/2013  . KNEE ARTHROSCOPY  02/2013    There were no vitals filed for this visit.  Subjective Assessment - 08/05/18 1022    Subjective  Pt states increased soreness in L side of neck this week. She states decreased tightness after last session.     Limitations  Sitting;Reading;Lifting;Standing;Walking;Writing;House hold activities    Patient Stated Goals  Decreased pain in L hip and neck region.     Currently in Pain?  Yes    Pain Score  4      Pain Location  Neck    Pain Orientation  Left    Pain Descriptors / Indicators  Aching    Pain Type  Chronic pain    Pain Onset  More than a month ago    Pain Frequency  Intermittent    Multiple Pain Sites  Yes    Pain Score  0    Pain Location  Hip    Pain Orientation  Left    Pain Descriptors / Indicators  Tightness    Pain Type  Chronic pain    Pain Onset  More than a month ago    Pain Frequency  Intermittent                       [facility] Adult PT Treatment/Exercise - 08/05/18 1028      Exercises   Exercises  Shoulder      Shoulder Exercises: Seated   Other Seated Exercises  --      Shoulder Exercises: Prone   Horizontal ABduction 1 Weight (lbs)  Prone T, x20 on L        Shoulder Exercises: Standing   External Rotation  20  reps    Theraband Level (Shoulder External Rotation)  Level 2 (Red)    External Rotation Limitations  Bil ER;     Row  20 reps    Theraband Level (Shoulder Row)  Level 3 (Green)    Other Standing Exercises  Horizontal Abd/Scap pull outs RTB x20;     Other Standing Exercises  UE scaption x20 , standing at noodle for posture;       Shoulder Exercises: Stretch   Corner Stretch  3 reps;30 seconds    Corner Stretch Limitations  90 deg      Manual Therapy   Manual Therapy  Soft tissue mobilization;Passive ROM    Manual therapy comments  Skilled palpation and monitoring of soft tissue during dry needling;     Soft tissue mobilization  STM to Bil UT, sub occipitals:     Passive ROM  Manual pec stretching and pec release ; L shoulder PROM;        Trigger Point Dry Needling - 08/05/18 1359    Consent Given?  Yes    Muscles Treated Upper Body  Upper trapezius;Infraspinatus    Upper Trapezius Response  Twitch reponse elicited;Palpable increased muscle length   Left   Infraspinatus Response  Palpable increased muscle length   Teres: palpable increased muscle length; Left;            PT Short Term Goals - 07/14/18 1250      PT  SHORT TERM GOAL #1   Title  Pt to report decreased pain in neck region to 3/10     Time  2    Period  Weeks    Status  New    Target Date  07/27/18      PT SHORT TERM GOAL #2   Title  Pt to be independent with initial HEP     Time  2    Period  Weeks    Status  New    Target Date  07/27/18        PT Long Term Goals - 07/14/18 1406      PT LONG TERM GOAL #1   Title  Pt to report decreased pain in cervical region to 0-2/10 with activity and work duties.     Time  6    Period  Weeks    Status  New    Target Date  08/24/18      PT LONG TERM GOAL #2   Title  Pt to report decreased pain in L hip/SI, to 0-2/10 to improve ability for IADLS and work duties.     Time  6    Period  Weeks    Status  New    Target Date  08/24/18      PT LONG TERM GOAL #3   Title  Pt to demo cervical ROM to be WNL and pain free, to improve ability for driving and work duties.     Time  6    Period  Weeks    Status  New    Target Date  08/24/18      PT LONG TERM GOAL #4   Title  Pt to be independent with HEP for postural strengtening and for lumbar/hip stabilization;     Time  6    Period  Weeks    Status  New    Target Date  08/24/18            Plan - 08/05/18 1354    Clinical Impression  Statement  Pt with tightness and soreness in L UT today, addressed with manual and dry needling. Pt with tightness in L pec, and weakness in L shoulder blade, making proper shoulder posture difficult. She will benefit from progressive strengthening for shoulder and scapular muslces.     Rehab Potential  Good    PT Frequency  2x / week    PT Duration  6 weeks    PT Treatment/Interventions  ADLs/Self Care Home Management;Canalith Repostioning;Cryotherapy;Electrical Stimulation;Iontophoresis '4mg'$ /ml Dexamethasone;Moist Heat;Therapeutic activities;Functional mobility training;Stair training;Gait training;Ultrasound;Therapeutic exercise;Balance training;Neuromuscular re-education;Patient/family education;Dry  needling;Passive range of motion;Manual techniques;Taping    Consulted and Agree with Plan of Care  Patient       Patient will benefit from skilled therapeutic intervention in order to improve the following deficits and impairments:  Pain, Improper body mechanics, Increased muscle spasms, Decreased mobility, Decreased activity tolerance, Decreased range of motion, Decreased strength, Impaired flexibility, Abnormal gait  Visit Diagnosis: Cervicalgia  Pain in left hip  Chronic left-sided low back pain without sciatica     Problem List Patient Active Problem List   Diagnosis Date Noted  . B12 deficiency 07/08/2018  . IUD (intrauterine device) in place 12/19/2017  . Greater trochanteric pain syndrome of left lower extremity 10/29/2017  . Osteoarthritis of spine with radiculopathy, cervical region 10/29/2017  . ADHD, on Vyvanse, followed by Dr. Toy Care 08/02/2017  . OSA (obstructive sleep apnea) 07/21/2017  . Polyarthralgia 06/28/2017  . Left wrist pain 06/28/2017  . Seasonal allergic rhinitis due to pollen, on Singulair, Flonase, and Astelin 06/23/2017  . Hyperhidrosis 06/23/2017  . Hypertension, on Benicar and Hytrin   . Depression     Lyndee Hensen, PT, DPT 2:00 PM  08/05/18    [facility] [place], [place], [address] Phone: [phone]   Fax:  [phone]  Name: Jill Frank MRN: [id number] Date of Birth: 21-Jan-1976   PHYSICAL THERAPY DISCHARGE SUMMARY  Visits from Start of Care: 3  Plan: Patient agrees to discharge.  Patient goals were met. Patient is being discharged due to not returning since the last visit.  ?????      Lyndee Hensen, PT, DPT 12:57 PM  12/13/18

## 2018-08-09 ENCOUNTER — Ambulatory Visit
Admission: RE | Admit: 2018-08-09 | Discharge: 2018-08-09 | Disposition: A | Discharge status: Home / Self Care | Payer: BC Managed Care – PPO | Source: Ambulatory Visit | Attending: Family Medicine | Admitting: Family Medicine

## 2018-08-09 MED ORDER — GADOBENATE DIMEGLUMINE 529 MG/ML IV SOLN
20.0000 mL | Freq: Once | INTRAVENOUS | Status: AC | PRN
  Administered 2018-08-09: 20 mL via INTRAVENOUS

## 2018-08-11 ENCOUNTER — Encounter: Payer: Self-pay | Admitting: Sports Medicine

## 2018-08-11 ENCOUNTER — Ambulatory Visit: Payer: BC Managed Care – PPO | Admitting: Sports Medicine

## 2018-08-11 ENCOUNTER — Encounter: Payer: BC Managed Care – PPO | Admitting: Physical Therapy

## 2018-08-11 VITALS — BP 130/86 | HR 81 | Ht 65.0 in | Wt 192.8 lb

## 2018-08-11 DIAGNOSIS — M9903 Segmental and somatic dysfunction of lumbar region: Secondary | ICD-10-CM

## 2018-08-11 DIAGNOSIS — G959 Disease of spinal cord, unspecified: Secondary | ICD-10-CM | POA: Diagnosis not present

## 2018-08-11 DIAGNOSIS — G8929 Other chronic pain: Secondary | ICD-10-CM

## 2018-08-11 DIAGNOSIS — M545 Low back pain, unspecified: Secondary | ICD-10-CM

## 2018-08-11 DIAGNOSIS — M542 Cervicalgia: Secondary | ICD-10-CM

## 2018-08-11 DIAGNOSIS — M9905 Segmental and somatic dysfunction of pelvic region: Secondary | ICD-10-CM

## 2018-08-11 DIAGNOSIS — M25552 Pain in left hip: Secondary | ICD-10-CM | POA: Diagnosis not present

## 2018-08-11 DIAGNOSIS — M9908 Segmental and somatic dysfunction of rib cage: Secondary | ICD-10-CM

## 2018-08-11 DIAGNOSIS — M9904 Segmental and somatic dysfunction of sacral region: Secondary | ICD-10-CM

## 2018-08-11 DIAGNOSIS — M9902 Segmental and somatic dysfunction of thoracic region: Secondary | ICD-10-CM

## 2018-08-11 NOTE — Patient Instructions (Addendum)

## 2018-08-11 NOTE — Progress Notes (Signed)
Jill Frank. Jill Frank, [facility] - 42 y.o. female MRN [id number]  Date of birth: 03-28-1976  Visit Date: 08/11/2018  PCP: Briscoe Deutscher, DO   Referred by: Briscoe Deutscher, DO  Scribe(s) for today's visit: Josepha Pigg, CMA  SUBJECTIVE:  Jill Frank is here for Follow-up (neck, back, L hip) .   06/28/2017: Jill Frank is a new patient presenting today for evaluation of LT wrist and shoulder pain.  Sx have been worse over the past 3 months. The pain is described as mild aching and discomfort. She feels like the wrist catches. She has tenderness to palpation on the radial aspect of the wrist.  Pain is rated as 0/10 at rest but 5/10 with use. Worsened with movement, even worse when using her thumb.  Improves with rest Therapies tried include : she has had steroid injections in the past. She has tried taking Meloxicam with minimal relief Other associated symptoms include: Pain does radiate a couple of inches into the radial aspect of the forearm.  Shoulder pain has been present x several years but has gotten worse over the past 3 months or so. She has no known injury or trauma to the shoulder. She just moved to the area the end of June 2018. She relates some of her increased pain to this. Pain is mostly on the anterior and posterior aspect of the shoulder. The pain radiates about half way down the triceps. Pain is worse at night, she is unable to lay on her LT side without her shoulder aching and burning. Pain is worse with internal and external rotation and adduction. Pain worsens the higher she raises her arm. She reports crepitus. Pain improves with rest. She has taken Meloxicam with minimal relief. She has not tried using heat or ice on the area because the pain comes and goes.  No recent xray of the shoulder or wrist.   10/29/2017: Compared to the last office visit on 06/28/17, her previously  described L shoulder and wrist pain symptoms are worsening w/ increased pain in the L arm w/ N/T also noted in the L UE from the shoulder to the hand. Current symptoms are moderate & are radiating to the L UE. She has been taking Meloxicam daily. Would also like to talk about her L hip.  Prior hx of L hip ant/post labral repair (2016) - Dr. Dimas Chyle.  12/06/2017: Compared to the last office visit on 10/29/17, her previously described neck and L arm pain symptoms are improving.  She states that the neck pain hasn't really changed but notes that the L arm pain has improved.  She states that she con't to have N/T into her L arm. Current symptoms are moderate & are radiating into the L UE. She has been taking the Gabapentin and feels this helps. She is also taking Meloxicam daily.  Taking Zithromax for a UTI currently (day 3)  02/25/2018: Compared to the last office visit, her previously described symptoms are improving. She also has c/o multiple musculoskeletal aches and pains.  Current symptoms are moderate & are radiating to multiple areas including L arm. She has been taking Gabapentin and Meloxicam with some relief. She started following Nitro Protocol about 1 month ago and reports no trouble with HA. She feels that the Nitroglycerin patches are helping.  She c/o L-sided epicondylitis. She is having more trouble with pelvic movement. She has been doing HEP intermittently but is  doing more outdoor yard work than anything else. She has also started Yoga but her elbow pain makes this difficult. She tried usuing cervical traction pillow but found it to be more bothersome than beneficial.   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  She states that she is feeling more muscle pain today.  She notes that she feels the cervical OMT improves her symptoms for a few days and finds this treatment  beneficial. Current symptoms are moderate & are radiating to L arm. She has been taking Gabapentin and Meloxicam with some relief.  She has been following Nitro Protocol with L hip pain and feels this has been beneficial.   04/11/18: Compared to the last office visit on 03/11/2018, her previously described neck and radiating L arm pain symptoms show no change.  She states that she feels like her L shoulder is more problematic at this point and is wondering if there is something separately wrong vs just referred pain. Current symptoms are moderate & are radiating to L arm. She has been taking Gabapentin and Meloxicam.  She has attended 1 PT session and doesn't plan to continue.  She has been receiving OMT treatment w/ Dr. Paulla Fore.  She states that she didn't do well w/ the cervical traction pillow but has purchased a new heating pad and feels this helped. She has been following the nitro protocol for her L hip and feels this has been beneficial.  04/26/2018: Compared to the last office visit on 04/11/18, her previously described neck and radiating L arm pain symptoms are improving w/ improved cervical ROM noted. Current symptoms are moderate & are radiating to L upper arm but less than previously.  She notes that she has been experiencing decreased N/T as well in her L UE. She has been taking Gabapentin and Meloxicam.  She received the [employer] at her last visit but has not looked at these.  She was also given towel stretches for her neck and is doing these.  05/13/2018:  Compared to the last office visit on 04/26/18, her previously described neck and L arm pain symptoms are improving overall.  She states that she's been trying to workout more at the gym. Current symptoms are mild & are radiating to L upper arm occasionally but much less than before. She has been taking Gabapentin and Meloxicam.  She has been doing her towel stretches.  She has tried the [employer]  some.  06/10/2018: Compared to the last office visit on 05/13/18, her previously described neck and L shoulder symptoms show no change since her last visit.  She states that she's had a very busy week this week w/ work. Current symptoms are mild & are radiating to the L upper arm She has been taking Gabapentin bid-tid and Meloxicam.  She has been doing her towel stretches.  She has done more of the [employer].  07/08/2018: Compared to the last office visit, her previously described symptoms are slightly worsening then at her last visit but still better than it was initially. Sx flare up when she doesn't get as much activity.  Current symptoms are mild-moderate & are radiating to the L shoulder but no longer down into the arm and hand.  She has not been doing exercises as often because she has been working more. She continues to take Gabapentin, Meloxicam and follow Nitro protocol. She denies HA when using Nitroglycerin patches.  She c/o pain the L SI joint and pelvis.   08/11/2018: Neck  Compared to the last office visit, her previously described symptoms are worsening, she woke up one morning and she was barely able to move her head. Sx have improved some since then but are still not as good as they were prior She reports occasional HA when neck pain flares up.  Current symptoms are moderate & are radiating to the L shoulder but now it doesn't go to her fingers anymore.  She has been seeing Lauren for dry-needling and she got minimal relief with this. She has been doing HEP including towel stretches. She has been taking Tylenol prn with some relief.  She has MRI of her head which showed bulging at C3-C4  Back and Hip Compared to the last office visit, her previously described symptoms show no change, she feels like her SI joint is off again.  Current symptoms are mild & are nonradiating. She has been following Nitro Protocol with minimal side effects. She has been doing [employer].     REVIEW OF SYSTEMS: Reports night time disturbances, since neck pain has flared up. Denies fevers, chills, or night sweats. Denies unexplained weight loss. Denies personal history of cancer. Denies changes in bowel or bladder habits. Denies recent unreported falls. Denies new or worsening dyspnea or wheezing. Reports headaches.  Denies numbness, tingling or weakness in the extremities. Denies dizziness or presyncopal episodes Denies lower extremity edema    HISTORY:  Prior history reviewed and updated per electronic medical record.  Social History   Occupational History  . Not on file  Tobacco Use  . Smoking status: Never Smoker  . Smokeless tobacco: Never Used  Substance and Sexual Activity  . Alcohol use: Yes    Comment: Social --1 per week  . Drug use: No  . Sexual activity: Yes    Birth control/protection: IUD    Comment: Mirena IUD inserted 02/2014   Social History   Social History Narrative  . Not on file    DATA OBTAINED & REVIEWED:  No results for input(s): HGBA1C, LABURIC, CREATINE in the last 8760 hours. 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  Brain is otherwise normal.  OBJECTIVE:  VS:  HT:5\' 5"  (165.1 cm)   WT:192 lb 12.8 oz (87.5 kg)  BMI:32.08    BP:130/86  HR:81bpm  TEMP: ( )  RESP:99 %   PHYSICAL EXAM: CONSTITUTIONAL: Well-developed, Well-nourished and In no acute distress PSYCHIATRIC: Alert & appropriately interactive. and Not depressed or anxious appearing. RESPIRATORY: No increased work of breathing and Trachea Midline EYES: Pupils are equal., EOM intact without nystagmus. and No scleral icterus.  Upper and Lower extremities: EXTREMITY EXAM: Warm and well perfused NEURO: unremarkable Normal associated myotomal distribution strength to manual muscle testing Normal sensation to light touch Normal and symmetric associated DTRs  MSK  Exam: Bilateral hip, cervical spine, lumbar spine  . Well aligned, no significant deformity. . No focal bony tenderness . No overlying skin changes.   RANGE OF MOTION & STRENGTH  . Normal, non-painful Internal and external range of motion of bilateral hips.  Good flexion extension of her back.   SPECIALITY TESTING:  Log Roll: normal, no pain FADIR: normal, no pain FABER: normal, no pain Stinchfield testing: normal, no pain Strength: Normal Axial loading produces: Mild pain and No crepitation        RIGHT  LEFT Straight leg raise-------------------------: normal, no pain                         normal, no pain   Neck:   NEURAL TENSION SIGNS Right Left  Brachial Plexus Squeeze: normal, no pain normal, no pain  Arm Squeeze Test: normal, no pain normal, no pain  Spurling's Compression Test: normal, no pain normal, no pain  Lhermitte's Compression test: Negative, no radiating pain  Persistently hyperreflexic with positive Hoffmann's on the left.  Normal on the right.     ASSESSMENT   1. Cervicalgia   2. Pain in left hip   3. Chronic left-sided low back pain without sciatica   4. Disease of spinal cord ([place])   5. Somatic dysfunction of thoracic region   6. Somatic dysfunction of lumbar region   7. Somatic dysfunction of rib cage region   8. Somatic dysfunction of pelvis region   9. Somatic dysfunction of sacral region     PLAN:  Pertinent additional documentation may be included in corresponding procedure notes, imaging studies, problem based documentation and patient instructions.  Procedures:  . Osteopathic manipulation was performed today based on physical exam findings.  Please see procedure note for further information including Osteopathic Exam findings  Medications:  No orders of the defined types were placed in this encounter.  Discussion/Instructions: No problem-specific Assessment & Plan notes found for this encounter.  . Normal recent findings given the  weakness and facial asymmetry on the right.  She does have positive Hoffmann's on the left as well and concern for potential underlying cervical myelopathy given the findings on the MRI of her brain that did show significant herniation at C3-4.  This is incompletely visualized however further diagnostic evaluation with dedicated cervical MRI ordered today. . Osteopathic manipulation was performed today but not to the cervical region. . Continue previously prescribed home exercise program.  . Further Testing ordered: MRI cervical spine . Discussed red flag symptoms that warrant earlier emergent evaluation and patient voices understanding. . Activity modifications and the importance of avoiding exacerbating activities (limiting pain to no more than a 4 / 10 during or following activity) recommended and discussed.  Follow-up:  . Return for MRI results review.  . At follow up will plan: to consider repeat osteopathic manipulation and Discuss further work-up/evaluation for her cervical spine    CMA/ATC served as scribe during this visit. History, Physical, and Plan performed by medical provider. Documentation and orders reviewed and attested to.      Gerda Diss, [place] Sports Medicine Physician

## 2018-08-11 NOTE — Progress Notes (Signed)
PROCEDURE NOTE : OSTEOPATHIC MANIPULATION The decision today to treat with Osteopathic Manipulative Therapy (OMT) was based on physical exam findings. Verbal consent was obtained following a discussion with the patient regarding the of risks, benefits and potential side effects, including an acute pain flare,post manipulation soreness and need for repeat treatments. Additionally, we specifically discussed the minimal risk of  injury to neurovascular structures associated with Cervical manipulation.   Contraindications to OMT: NONE  Manipulation was performed as below: Regions Treated OMT Techniques Used  Thoracic spine Ribs Lumbar spine Pelvis Sacrum HVLA muscle energy myofascial release soft tissue   The patient tolerated the treatment well and reported Improved symptoms following treatment today. Patient was given medications, exercises, stretches and lifestyle modifications per AVS and verbally.    OSTEOPATHIC/STRUCTURAL EXAM:   T2 ERS left (Extended, Rotated & Sidebent) T6 -10 Neutral, Rotated RIGHT, Sidebent LEFT  Rib 6 Right  Posterior L4 FRS right (Flexed, Rotated & Sidebent) Left psoas spasm Right anterior innonimate L on L sacral torsion

## 2018-08-13 ENCOUNTER — Encounter: Payer: Self-pay | Admitting: Sports Medicine

## 2018-08-15 NOTE — Progress Notes (Signed)
NEUROLOGY CONSULTATION NOTE  Jill Frank MRN: [id number] DOB: 1976/02/15  Referring provider: Briscoe Deutscher, DO Primary care provider: Briscoe Deutscher, DO  Reason for consult:  Facial asymmetry  HISTORY OF PRESENT ILLNESS: Jill Frank is a 42 year old female who presents for right facial weakness.  History supplemented by referring provider's note.  Several months ago, she began noticing some flattening of the right nasolabial fold and droop on right side of mouth.  She is not certain how long it developed because she wasn't paying attention, so it wasn't dramatic.  She denied headache, visual changes, facial numbness, hyperacusis, trouble swallowing or change in taste.  Over the past few months, she thinks it has gotten a little worse.  She takes daily Valtrex  CT of head with and without contrast from 07/19/18 was personally reviewed and was unremarkable.  MRI of brain with and without contrast with attention to the facial nerve from 07/22/18 was personally reviewed and was normal.  PAST MEDICAL HISTORY: Past Medical History:  Diagnosis Date  . Abnormal Pap smear of cervix 05/2013   --had colposcopy for LGSIL but no treatment to cervix  . ADHD   . Anxiety   . Arthritis   . Cancer ([place]) 1998   back  . Depression   . Fibroadenoma    Rt.Breast  . Hyperhidrosis 06/23/2017  . Hyperhidrosis   . Hypertension   . Melasma   . Obstructive sleep apnea   . Piriformis syndrome   . PTSD (post-traumatic stress disorder)   . Sacroiliitis ([place])   . Seasonal allergic rhinitis due to pollen 06/23/2017  . Vitamin D deficiency     PAST SURGICAL HISTORY: Past Surgical History:  Procedure Laterality Date  . BREAST BIOPSY Right    benign  . BREAST SURGERY     breast biopsy  . COLPOSCOPY  05/2013   LGSIL--no treatment  . excision of melanoma  1998   --back  . HIP ARTHROSCOPY W/ LABRAL REPAIR Left 11/2013  . KNEE ARTHROSCOPY  02/2013    MEDICATIONS: Current Outpatient  Medications on File Prior to Visit  Medication Sig Dispense Refill  . ALPRAZolam (XANAX) 0.5 MG tablet Take 0.5 mg by mouth as needed for anxiety (For air travel.).    Marland Kitchen azelastine (ASTELIN) 0.1 % nasal spray Place 1 spray into both nostrils daily. Use in each nostril as directed 30 mL 3  . buPROPion (WELLBUTRIN XL) 150 MG 24 hr tablet Take 1 tablet (150 mg total) by mouth 3 (three) times daily. 270 tablet 1  . clonazePAM (KLONOPIN) 0.5 MG tablet Take 1 tablet (0.5 mg total) by mouth 2 (two) times daily as needed for anxiety. 180 tablet 0  . Cyanocobalamin (VITAMIN B 12 PO) Take by mouth daily.     . DULoxetine (CYMBALTA) 30 MG capsule TAKE 1 CAPSULE 3 TIMES A   DAY 270 capsule 1  . Fluocin-Hydroquinone-Tretinoin (TRI-LUMA) 0.01-4-0.05 % CREA Apply daily as needed. 90 g 1  . fluticasone (FLONASE) 50 MCG/ACT nasal spray Place 1 spray into both nostrils daily. 16 g 2  . gabapentin (NEURONTIN) 300 MG capsule TAKE 1 CAPSULE 3 TIMES     DAILY AS NEEDED 270 capsule 1  . levonorgestrel (MIRENA) 20 MCG/24HR IUD 1 each by Intrauterine route once.    . meloxicam (MOBIC) 15 MG tablet TAKE 1 TABLET DAILY. 90 tablet 1  . montelukast (SINGULAIR) 10 MG tablet TAKE 1 TABLET AT BEDTIME 90 tablet 1  . Multiple Vitamins-Minerals (VITAMIN D3 COMPLETE PO)  Take by mouth.    . nitroGLYCERIN (NITRODUR - DOSED IN MG/24 HR) 0.2 mg/hr patch Place 1/4 to 1/2 of a patch over affected region. Remove and replace once daily.  Slightly alter skin placement daily 90 patch 0  . olmesartan (BENICAR) 40 MG tablet Take 1 tablet (40 mg total) by mouth daily. 90 tablet 1  . terazosin (HYTRIN) 10 MG capsule TAKE 1 CAPSULE AT BEDTIME 90 capsule 1  . valACYclovir (VALTREX) 500 MG tablet TAKE 1 TABLET DAILY 90 tablet 1  . VYVANSE 70 MG capsule Take 1 capsule by mouth daily.  0   No current facility-administered medications on file prior to visit.     ALLERGIES: Allergies  Allergen Reactions  . Doxycycline Hives  . Erythromycin  Hives  . Penicillins Hives    FAMILY HISTORY: Family History  Problem Relation Age of Onset  . Breast cancer Maternal Aunt 57       A & W  . Breast cancer Maternal Grandmother 45       mets to brain  . Autoimmune disease Mother   . Anxiety disorder Father   . CAD Father   . Other Father        dyslipidemia  . Bipolar disorder Sister   . ADD / ADHD Sister   . Thyroid disease Maternal Grandfather   . Cancer Paternal Grandfather 50       dec colon ca   SOCIAL HISTORY: Social History   Socioeconomic History  . Marital status: Single    Spouse name: Not on file  . Number of children: Not on file  . Years of education: Not on file  . Highest education level: Not on file  Occupational History  . Not on file  Social Needs  . Financial resource strain: Not on file  . Food insecurity:    Worry: Not on file    Inability: Not on file  . Transportation needs:    Medical: Not on file    Non-medical: Not on file  Tobacco Use  . Smoking status: Never Smoker  . Smokeless tobacco: Never Used  Substance and Sexual Activity  . Alcohol use: Yes    Comment: Social --1 per week  . Drug use: No  . Sexual activity: Yes    Birth control/protection: IUD    Comment: Mirena IUD inserted 02/2014  Lifestyle  . Physical activity:    Days per week: Not on file    Minutes per session: Not on file  . Stress: Not on file  Relationships  . Social connections:    Talks on phone: Not on file    Gets together: Not on file    Attends religious service: Not on file    Active member of club or organization: Not on file    Attends meetings of clubs or organizations: Not on file    Relationship status: Not on file  . Intimate partner violence:    Fear of current or ex partner: Not on file    Emotionally abused: Not on file    Physically abused: Not on file    Forced sexual activity: Not on file  Other Topics Concern  . Not on file  Social History Narrative  . Not on file    REVIEW OF  SYSTEMS: Constitutional: No fevers, chills, or sweats, no generalized fatigue, change in appetite Eyes: No visual changes, double vision, eye pain Ear, nose and throat: No hearing loss, ear pain, nasal congestion, sore throat Cardiovascular: No  chest pain, palpitations Respiratory:  No shortness of breath at rest or with exertion, wheezes GastrointestinaI: No nausea, vomiting, diarrhea, abdominal pain, fecal incontinence Genitourinary:  No dysuria, urinary retention or frequency Musculoskeletal:  No neck pain, back pain Integumentary: No rash, pruritus, skin lesions Neurological: as above Psychiatric: No depression, insomnia, anxiety Endocrine: No palpitations, fatigue, diaphoresis, mood swings, change in appetite, change in weight, increased thirst Hematologic/Lymphatic:  No purpura, petechiae. Allergic/Immunologic: no itchy/runny eyes, nasal congestion, recent allergic reactions, rashes  PHYSICAL EXAM: Blood pressure 110/80, pulse (!) 104, height 5\' 5"  (1.651 m), weight 192 lb 6 oz (87.3 kg), SpO2 98 %. General: No acute distress.  Patient appears well-groomed.  Head:  Normocephalic/atraumatic Eyes:  fundi examined but not visualized Neck: supple, no paraspinal tenderness, full range of motion Back: No paraspinal tenderness Heart: regular rate and rhythm Lungs: Clear to auscultation bilaterally. Vascular: No carotid bruits. Neurological Exam: Mental status: alert and oriented to person, place, and time, recent and remote memory intact, fund of knowledge intact, attention and concentration intact, speech fluent and not dysarthric, language intact. Cranial nerves: CN I: not tested CN II: pupils equal, round and reactive to light, visual fields intact CN III, IV, VI:  full range of motion, no nystagmus, no ptosis CN V: facial sensation intact CN VII: Upper and lower face symmetric CN VIII: hearing intact CN IX, X: gag intact, uvula midline CN XI: sternocleidomastoid and trapezius  muscles intact CN XII: tongue midline Bulk & Tone: normal, no fasciculations. Motor:  5/5 throughout  Sensation:  Pinprick and vibration sensation intact. Deep Tendon Reflexes:  3+ throughout, toes downgoing.  Finger to nose testing:  Without dysmetria.  Heel to shin:  Without dysmetria.  Gait:  Normal station and stride.  Romberg negative.  IMPRESSION: Subjective facial asymmetry.  Personally, I do not really appreciate any facial droop but she notices it.  MRI of brain does not not reveal any intracranial abnormality.   PLAN: 1.  We will check blood work for common causes of facial nerve palsy: ANA, sed rate, ACE, Lyme, Sjogren's, and ANCA. 2.  Further recommendations pending results.  Otherwise, follow-up as needed.  Thank you for allowing me to take part in the care of this patient.  Metta Clines, DO  CC: Briscoe Deutscher, DO

## 2018-08-16 ENCOUNTER — Other Ambulatory Visit (INDEPENDENT_AMBULATORY_CARE_PROVIDER_SITE_OTHER): Payer: BC Managed Care – PPO

## 2018-08-16 ENCOUNTER — Ambulatory Visit: Payer: BC Managed Care – PPO | Admitting: Neurology

## 2018-08-16 ENCOUNTER — Encounter: Payer: Self-pay | Admitting: Neurology

## 2018-08-16 VITALS — BP 110/80 | HR 104 | Ht 65.0 in | Wt 192.4 lb

## 2018-08-16 DIAGNOSIS — Q670 Congenital facial asymmetry: Secondary | ICD-10-CM

## 2018-08-16 LAB — SEDIMENTATION RATE: SED RATE: 6 mm/h (ref 0–20)

## 2018-08-16 NOTE — Patient Instructions (Signed)
1.  We will check labs for some common causes of facial nerve weakness:  ANA, Sed Rate, ACE, Lyme, Sjogren's, ANCA. 2.  Further recommendations pending results.

## 2018-08-17 LAB — LYME AB/WESTERN BLOT REFLEX: LYME DISEASE AB, QUANT, IGM: <0.8 index (ref 0.00–0.79)

## 2018-08-18 ENCOUNTER — Ambulatory Visit
Admission: RE | Admit: 2018-08-18 | Discharge: 2018-08-18 | Disposition: A | Discharge status: Home / Self Care | Payer: BC Managed Care – PPO | Source: Ambulatory Visit | Attending: Sports Medicine | Admitting: Sports Medicine

## 2018-08-18 ENCOUNTER — Ambulatory Visit: Payer: BC Managed Care – PPO

## 2018-08-18 DIAGNOSIS — G959 Disease of spinal cord, unspecified: Secondary | ICD-10-CM

## 2018-08-18 LAB — ANCA SCREEN W REFLEX TITER: ANCA Screen: NEGATIVE

## 2018-08-18 LAB — ANA: ANA: POSITIVE — AB

## 2018-08-18 LAB — SJOGREN'S SYNDROME ANTIBODS(SSA + SSB)
SSA (RO) (ENA) ANTIBODY, IGG: NEGATIVE AI
SSB (LA) (ENA) ANTIBODY, IGG: NEGATIVE AI

## 2018-08-18 LAB — ANTI-NUCLEAR AB-TITER (ANA TITER)

## 2018-08-18 LAB — ANGIOTENSIN CONVERTING ENZYME: Angiotensin-Converting Enzyme: 31 U/L (ref 9–67)

## 2018-08-19 ENCOUNTER — Encounter: Payer: Self-pay | Admitting: Family Medicine

## 2018-08-22 ENCOUNTER — Telehealth: Payer: Self-pay

## 2018-08-22 NOTE — Telephone Encounter (Signed)
-----   Message from Pieter Partridge, DO sent at 08/19/2018  7:56 AM EST ----- ANA is borderline positive.  However, the specific tests for autoimmune disease that may cause facial asymmetry are negative.  Therefore, I don't think it is clinically relevant.  All specific tests are negative.

## 2018-08-22 NOTE — Telephone Encounter (Signed)
Called and LMOVM advising Pt of results

## 2018-08-23 ENCOUNTER — Ambulatory Visit: Payer: BC Managed Care – PPO | Admitting: Sports Medicine

## 2018-08-23 ENCOUNTER — Encounter: Payer: Self-pay | Admitting: Sports Medicine

## 2018-08-23 VITALS — BP 122/80 | HR 86 | Ht 65.0 in | Wt 191.4 lb

## 2018-08-23 DIAGNOSIS — M9904 Segmental and somatic dysfunction of sacral region: Secondary | ICD-10-CM

## 2018-08-23 DIAGNOSIS — G8929 Other chronic pain: Secondary | ICD-10-CM

## 2018-08-23 DIAGNOSIS — M9908 Segmental and somatic dysfunction of rib cage: Secondary | ICD-10-CM

## 2018-08-23 DIAGNOSIS — M9902 Segmental and somatic dysfunction of thoracic region: Secondary | ICD-10-CM

## 2018-08-23 DIAGNOSIS — M9905 Segmental and somatic dysfunction of pelvic region: Secondary | ICD-10-CM

## 2018-08-23 DIAGNOSIS — R768 Other specified abnormal immunological findings in serum: Secondary | ICD-10-CM

## 2018-08-23 DIAGNOSIS — M9903 Segmental and somatic dysfunction of lumbar region: Secondary | ICD-10-CM | POA: Diagnosis not present

## 2018-08-23 DIAGNOSIS — M545 Low back pain, unspecified: Secondary | ICD-10-CM

## 2018-08-23 DIAGNOSIS — M4722 Other spondylosis with radiculopathy, cervical region: Secondary | ICD-10-CM

## 2018-08-23 DIAGNOSIS — M25552 Pain in left hip: Secondary | ICD-10-CM

## 2018-08-23 NOTE — Progress Notes (Signed)
Jill Frank. Jill Frank, [facility] [place] - 42 y.o. female MRN [id number]  Date of birth: 02/15/1976  Visit Date: 08/23/2018  PCP: Briscoe Deutscher, DO   Referred by: Briscoe Deutscher, DO  Scribe(s) for today's visit: Wendy Poet, LAT, ATC  SUBJECTIVE:  Jill Frank is here for Follow-up (Neck pain and MRI review) .   06/28/2017: Jill Frank is a new patient presenting today for evaluation of LT wrist and shoulder pain.  Sx have been worse over the past 3 months. The pain is described as mild aching and discomfort. She feels like the wrist catches. She has tenderness to palpation on the radial aspect of the wrist.  Pain is rated as 0/10 at rest but 5/10 with use. Worsened with movement, even worse when using her thumb.  Improves with rest Therapies tried include : she has had steroid injections in the past. She has tried taking Meloxicam with minimal relief Other associated symptoms include: Pain does radiate a couple of inches into the radial aspect of the forearm.  Shoulder pain has been present x several years but has gotten worse over the past 3 months or so. She has no known injury or trauma to the shoulder. She just moved to the area the end of June 2018. She relates some of her increased pain to this. Pain is mostly on the anterior and posterior aspect of the shoulder. The pain radiates about half way down the triceps. Pain is worse at night, she is unable to lay on her LT side without her shoulder aching and burning. Pain is worse with internal and external rotation and adduction. Pain worsens the higher she raises her arm. She reports crepitus. Pain improves with rest. She has taken Meloxicam with minimal relief. She has not tried using heat or ice on the area because the pain comes and goes.  No recent xray of the shoulder or wrist.   10/29/2017: Compared to the last office visit on 06/28/17, her  previously described L shoulder and wrist pain symptoms are worsening w/ increased pain in the L arm w/ N/T also noted in the L UE from the shoulder to the hand. Current symptoms are moderate & are radiating to the L UE. She has been taking Meloxicam daily. Would also like to talk about her L hip.  Prior hx of L hip ant/post labral repair (2016) - Dr. Dimas Chyle.  12/06/2017: Compared to the last office visit on 10/29/17, her previously described neck and L arm pain symptoms are improving.  She states that the neck pain hasn't really changed but notes that the L arm pain has improved.  She states that she con't to have N/T into her L arm. Current symptoms are moderate & are radiating into the L UE. She has been taking the Gabapentin and feels this helps. She is also taking Meloxicam daily.  Taking Zithromax for a UTI currently (day 3)  02/25/2018: Compared to the last office visit, her previously described symptoms are improving. She also has c/o multiple musculoskeletal aches and pains.  Current symptoms are moderate & are radiating to multiple areas including L arm. She has been taking Gabapentin and Meloxicam with some relief. She started following Nitro Protocol about 1 month ago and reports no trouble with HA. She feels that the Nitroglycerin patches are helping.  She c/o L-sided epicondylitis. She is having more trouble with pelvic movement. She has been doing HEP intermittently  but is doing more outdoor yard work than anything else. She has also started Yoga but her elbow pain makes this difficult. She tried usuing cervical traction pillow but found it to be more bothersome than beneficial.   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  She states that she is feeling more muscle pain today.  She notes that she feels the cervical OMT improves her symptoms for a few days and finds this treatment  beneficial. Current symptoms are moderate & are radiating to L arm. She has been taking Gabapentin and Meloxicam with some relief.  She has been following Nitro Protocol with L hip pain and feels this has been beneficial.   04/11/18: Compared to the last office visit on 03/11/2018, her previously described neck and radiating L arm pain symptoms show no change.  She states that she feels like her L shoulder is more problematic at this point and is wondering if there is something separately wrong vs just referred pain. Current symptoms are moderate & are radiating to L arm. She has been taking Gabapentin and Meloxicam.  She has attended 1 PT session and doesn't plan to continue.  She has been receiving OMT treatment w/ Dr. Paulla Fore.  She states that she didn't do well w/ the cervical traction pillow but has purchased a new heating pad and feels this helped. She has been following the nitro protocol for her L hip and feels this has been beneficial.  04/26/2018: Compared to the last office visit on 04/11/18, her previously described neck and radiating L arm pain symptoms are improving w/ improved cervical ROM noted. Current symptoms are moderate & are radiating to L upper arm but less than previously.  She notes that she has been experiencing decreased N/T as well in her L UE. She has been taking Gabapentin and Meloxicam.  She received the [employer] at her last visit but has not looked at these.  She was also given towel stretches for her neck and is doing these.  05/13/2018:  Compared to the last office visit on 04/26/18, her previously described neck and L arm pain symptoms are improving overall.  She states that she's been trying to workout more at the gym. Current symptoms are mild & are radiating to L upper arm occasionally but much less than before. She has been taking Gabapentin and Meloxicam.  She has been doing her towel stretches.  She has tried the [employer]  some.  06/10/2018: Compared to the last office visit on 05/13/18, her previously described neck and L shoulder symptoms show no change since her last visit.  She states that she's had a very busy week this week w/ work. Current symptoms are mild & are radiating to the L upper arm She has been taking Gabapentin bid-tid and Meloxicam.  She has been doing her towel stretches.  She has done more of the [employer].  07/08/2018: Compared to the last office visit, her previously described symptoms are slightly worsening then at her last visit but still better than it was initially. Sx flare up when she doesn't get as much activity.  Current symptoms are mild-moderate & are radiating to the L shoulder but no longer down into the arm and hand.  She has not been doing exercises as often because she has been working more. She continues to take Gabapentin, Meloxicam and follow Nitro protocol. She denies HA when using Nitroglycerin  patches.  She c/o pain the L SI joint and pelvis.   08/11/2018: Neck  Compared to the last office visit, her previously described symptoms are worsening, she woke up one morning and she was barely able to move her head. Sx have improved some since then but are still not as good as they were prior She reports occasional HA when neck pain flares up.  Current symptoms are moderate & are radiating to the L shoulder but now it doesn't go to her fingers anymore.  She has been seeing Lauren for dry-needling and she got minimal relief with this. She has been doing HEP including towel stretches. She has been taking Tylenol prn with some relief.  She has MRI of her head which showed bulging at C3-C4  Back and Hip Compared to the last office visit, her previously described symptoms show no change, she feels like her SI joint is off again.  Current symptoms are mild & are nonradiating. She has been following Nitro Protocol with minimal side effects. She has been doing [employer].    08/23/2018: Compared to the last office visit on 08/11/18, her previously described neck symptoms show no change from her last appt but overall still worse.  She is here today to review her c-spine MRI.  She states that she had a + ANA result from her bloodwork obtained by Dr. Tomi Likens and wants to discuss some further bloodwork. Current symptoms are moderate & are radiating to L shoulder. She has been doing her HEP consisting of towel stretches.  She has been taking Tylenol prn w/ some relief.  She notes that she is "off" in her hips and would like those adjusted.  C-spine XR - 06/28/17 C-spine MRI - 08/18/18  REVIEW OF SYSTEMS: Reports night time disturbances, since neck pain has flared up. Denies fevers, chills, or night sweats. Denies unexplained weight loss. Denies personal history of cancer. Denies changes in bowel or bladder habits. Denies recent unreported falls. Denies new or worsening dyspnea or wheezing. Reports headaches.  Denies numbness, tingling or weakness in the extremities. Denies dizziness or presyncopal episodes Denies lower extremity edema    HISTORY:  Prior history reviewed and updated per electronic medical record.  Social History   Occupational History  . Occupation: Programme researcher, broadcasting/film/video    Employer: [employer]  Tobacco Use  . Smoking status: Never Smoker  . Smokeless tobacco: Never Used  Substance and Sexual Activity  . Alcohol use: Yes    Comment: Social --1 per week  . Drug use: No  . Sexual activity: Yes    Birth control/protection: IUD    Comment: Mirena IUD inserted 02/2014   Social History   Social History Narrative   Lives alone in a 2 story home.  No children.  Right handed.  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  Education: doctorate.     DATA OBTAINED & REVIEWED:  No results for input(s): HGBA1C, LABURIC, CREATINE in the last 8760 hours. Marland Kitchen MRI brain 08/09/2018: Marked herniation at C3-4 with indentation of the spinal cord without evidence of  overt myelopathy but limited exam.  Brain is otherwise normal.  OBJECTIVE:  VS:  HT:5\' 5"  (165.1 cm)   WT:191 lb 6.4 oz (86.8 kg)  BMI:31.85    BP:122/80  HR:86bpm  TEMP: ( )  RESP:97 %   PHYSICAL EXAM: CONSTITUTIONAL: Well-developed, Well-nourished and In no acute distress PSYCHIATRIC: Alert & appropriately interactive. and Not depressed or anxious appearing. RESPIRATORY: No increased work of breathing and Trachea Midline EYES:  Pupils are equal., EOM intact without nystagmus. and No scleral icterus.  Upper and Lower extremities: EXTREMITY EXAM: Warm and well perfused NEURO: unremarkable Normal associated myotomal distribution strength to manual muscle testing Normal sensation to light touch Normal and symmetric associated DTRs  MSK Exam: Bilateral hip, cervical spine, lumbar spine  . Well aligned, no significant deformity. . No focal bony tenderness . No overlying skin changes.   RANGE OF MOTION & STRENGTH  . Normal, non-painful Internal and external range of motion of bilateral hips.  Good flexion extension of her back.   SPECIALITY TESTING:  Log Roll: normal, no pain FADIR: normal, no pain FABER: normal, no pain Stinchfield testing: normal, no pain Strength: Normal Axial loading produces: Mild pain and No crepitation        RIGHT    LEFT Straight leg raise-------------------------: normal, no pain                         normal, no pain   Neck:   NEURAL TENSION SIGNS Right Left  Brachial Plexus Squeeze: normal, no pain positive, mild pain  Arm Squeeze Test: normal, no pain normal, no pain  Spurling's Compression Test: normal, no pain positive, mild pain  Lhermitte's Compression test: Negative, no radiating pain  Persistently hyperreflexic.  Hoffmann's has resolved.     ASSESSMENT   1. Elevated antinuclear antibody (ANA) level   2. Osteoarthritis of spine with radiculopathy, cervical region   3. Somatic dysfunction of lumbar region   4. Somatic  dysfunction of pelvis region   5. Somatic dysfunction of rib cage region   6. Somatic dysfunction of sacral region   7. Pain in left hip   8. Chronic left-sided low back pain without sciatica   9. Somatic dysfunction of thoracic region     PLAN:  Pertinent additional documentation may be included in corresponding procedure notes, imaging studies, problem based documentation and patient instructions.  Procedures:  . Osteopathic manipulation was performed today based on physical exam findings.  Please see procedure note for further information including Osteopathic Exam findings  Medications:  No orders of the defined types were placed in this encounter.  Discussion/Instructions: Osteoarthritis of spine with radiculopathy, cervical region Given the significant change on her MRI neurosurgical consultation recommended and placed today.>50% of this 25 minute visit spent in direct patient counseling and/or coordination of care.  Discussion was focused on education regarding the in discussing the pathoetiology and anticipated clinical course of the above condition.  Ultimately given the possibility of central cord syndrome possible surgical decompression recommended although the neck and left arm symptoms have improved.  She is hyperreflexic at baseline and Hoffmann sign has resolved today.  Elevated antinuclear antibody (ANA) level 1-40 titer, homogeneous. Given the findings we discussed the option for referral to rheumatology.  Discussed that this is likely low yield given the high probability of a false positive at the 1-40 titer however referral will be placed for second opinion given the polyarthralgia complaint as well as advanced spinal changes for her age.  Chronic left-sided low back pain without sciatica No lower extremity radicular symptoms.  Osteopathic manipulation was performed today to the lower axial spine as well as hips and pelvis.  Continue with home therapeutic exercise as  previously prescribed.  . Continue previously prescribed home exercise program.  . Discussed red flag symptoms that warrant earlier emergent evaluation and patient voices understanding. . Activity modifications and the importance of avoiding exacerbating activities (limiting  pain to no more than a 4 / 10 during or following activity) recommended and discussed.  Follow-up:  . Return if symptoms worsen or fail to improve.  . At follow up will plan: to consider repeat osteopathic manipulation    CMA/ATC served as scribe during this visit. History, Physical, and Plan performed by medical provider. Documentation and orders reviewed and attested to.      Gerda Diss, [facility] Physician

## 2018-08-27 ENCOUNTER — Encounter: Payer: Self-pay | Admitting: Sports Medicine

## 2018-08-27 DIAGNOSIS — M545 Low back pain, unspecified: Secondary | ICD-10-CM | POA: Insufficient documentation

## 2018-08-27 DIAGNOSIS — R768 Other specified abnormal immunological findings in serum: Secondary | ICD-10-CM | POA: Insufficient documentation

## 2018-08-27 DIAGNOSIS — G8929 Other chronic pain: Secondary | ICD-10-CM | POA: Insufficient documentation

## 2018-08-27 NOTE — Assessment & Plan Note (Signed)
1-40 titer, homogeneous. Given the findings we discussed the option for referral to rheumatology.  Discussed that this is likely low yield given the high probability of a false positive at the 1-40 titer however referral will be placed for second opinion given the polyarthralgia complaint as well as advanced spinal changes for her age.

## 2018-08-27 NOTE — Assessment & Plan Note (Signed)
Given the significant change on her MRI neurosurgical consultation recommended and placed today.>50% of this 25 minute visit spent in direct patient counseling and/or coordination of care.  Discussion was focused on education regarding the in discussing the pathoetiology and anticipated clinical course of the above condition.  Ultimately given the possibility of central cord syndrome possible surgical decompression recommended although the neck and left arm symptoms have improved.  She is hyperreflexic at baseline and Hoffmann sign has resolved today.

## 2018-08-27 NOTE — Assessment & Plan Note (Signed)
No lower extremity radicular symptoms.  Osteopathic manipulation was performed today to the lower axial spine as well as hips and pelvis.  Continue with home therapeutic exercise as previously prescribed.

## 2018-08-27 NOTE — Progress Notes (Signed)
  PROCEDURE NOTE : OSTEOPATHIC MANIPULATION The decision today to treat with Osteopathic Manipulative Therapy (OMT) was based on physical exam findings. Verbal consent was obtained following a discussion with the patient regarding the of risks, benefits and potential side effects, including an acute pain flare,post manipulation soreness and need for repeat treatments.     Contraindications to OMT: NONE  Manipulation was performed as below: Regions Treated OMT Techniques Used  Thoracic spine Ribs Lumbar spine Pelvis Sacrum HVLA muscle energy myofascial release soft tissue   The patient tolerated the treatment well and reported Improved symptoms following treatment today. Patient was given medications, exercises, stretches and lifestyle modifications per AVS and verbally.    OSTEOPATHIC/STRUCTURAL EXAM:   T2 ERS left (Extended, Rotated & Sidebent) T6 -10 Neutral, Rotated RIGHT, Sidebent LEFT  Rib 6 Right  Posterior L4 FRS right (Flexed, Rotated & Sidebent) Left psoas spasm Right anterior innonimate L on L sacral torsion

## 2018-09-08 ENCOUNTER — Encounter: Payer: Self-pay | Admitting: Sports Medicine

## 2018-09-13 ENCOUNTER — Encounter: Payer: Self-pay | Admitting: Sports Medicine

## 2018-09-13 ENCOUNTER — Telehealth: Payer: Self-pay | Admitting: Obstetrics and Gynecology

## 2018-09-13 ENCOUNTER — Encounter: Payer: Self-pay | Admitting: Obstetrics and Gynecology

## 2018-09-13 ENCOUNTER — Other Ambulatory Visit: Payer: Self-pay

## 2018-09-13 ENCOUNTER — Ambulatory Visit: Payer: BC Managed Care – PPO | Admitting: Sports Medicine

## 2018-09-13 ENCOUNTER — Ambulatory Visit (INDEPENDENT_AMBULATORY_CARE_PROVIDER_SITE_OTHER): Payer: BC Managed Care – PPO | Admitting: Obstetrics and Gynecology

## 2018-09-13 VITALS — BP 104/60 | HR 88 | Resp 16 | Ht 65.25 in | Wt 188.0 lb

## 2018-09-13 VITALS — BP 130/92 | HR 84 | Ht 65.25 in | Wt 187.6 lb

## 2018-09-13 DIAGNOSIS — R768 Other specified abnormal immunological findings in serum: Secondary | ICD-10-CM | POA: Diagnosis not present

## 2018-09-13 DIAGNOSIS — M9905 Segmental and somatic dysfunction of pelvic region: Secondary | ICD-10-CM

## 2018-09-13 DIAGNOSIS — G959 Disease of spinal cord, unspecified: Secondary | ICD-10-CM

## 2018-09-13 DIAGNOSIS — N6321 Unspecified lump in the left breast, upper outer quadrant: Secondary | ICD-10-CM

## 2018-09-13 DIAGNOSIS — Z7189 Other specified counseling: Secondary | ICD-10-CM | POA: Diagnosis not present

## 2018-09-13 DIAGNOSIS — N6313 Unspecified lump in the right breast, lower outer quadrant: Secondary | ICD-10-CM | POA: Diagnosis not present

## 2018-09-13 DIAGNOSIS — M9904 Segmental and somatic dysfunction of sacral region: Secondary | ICD-10-CM

## 2018-09-13 DIAGNOSIS — Z803 Family history of malignant neoplasm of breast: Secondary | ICD-10-CM | POA: Diagnosis not present

## 2018-09-13 DIAGNOSIS — Z30431 Encounter for routine checking of intrauterine contraceptive device: Secondary | ICD-10-CM

## 2018-09-13 DIAGNOSIS — Z01419 Encounter for gynecological examination (general) (routine) without abnormal findings: Secondary | ICD-10-CM

## 2018-09-13 DIAGNOSIS — M4722 Other spondylosis with radiculopathy, cervical region: Secondary | ICD-10-CM | POA: Diagnosis not present

## 2018-09-13 DIAGNOSIS — M25552 Pain in left hip: Secondary | ICD-10-CM

## 2018-09-13 DIAGNOSIS — M9903 Segmental and somatic dysfunction of lumbar region: Secondary | ICD-10-CM | POA: Diagnosis not present

## 2018-09-13 DIAGNOSIS — Z23 Encounter for immunization: Secondary | ICD-10-CM

## 2018-09-13 DIAGNOSIS — Z7185 Encounter for immunization safety counseling: Secondary | ICD-10-CM

## 2018-09-13 NOTE — Progress Notes (Signed)
PROCEDURE NOTE : OSTEOPATHIC MANIPULATION The decision today to treat with Osteopathic Manipulative Therapy (OMT) was based on physical exam findings. Verbal consent was obtained following a discussion with the patient regarding the of risks, benefits and potential side effects, including an acute pain flare,post manipulation soreness and need for repeat treatments.     Contraindications to OMT: NONE  Manipulation was performed as below: Regions Treated & Osteopathic Exam Findings  L4 FRS RIGHT (Flexed, Rotated & Sidebent) Left psoas spasm Left anterior innonimate R on R sacral torsion   OMT Techniques Used  . HVLA . muscle energy . myofascial release   The patient tolerated the treatment well and reported Improved symptoms following treatment today. Patient was given medications, exercises, stretches and lifestyle modifications per AVS and verbally.

## 2018-09-13 NOTE — Telephone Encounter (Signed)
Patient is ready to schedule her IUD removal appointment. She is due June 2020.

## 2018-09-13 NOTE — Patient Instructions (Signed)
EXERCISE AND DIET:  We recommended that you start or continue a regular exercise program for good health. Regular exercise means any activity that makes your heart beat faster and makes you sweat.  We recommend exercising at least 30 minutes per day at least 3 days a week, preferably 4 or 5.  We also recommend a diet low in fat and sugar.  Inactivity, poor dietary choices and obesity can cause diabetes, heart attack, stroke, and kidney damage, among others.    ALCOHOL AND SMOKING:  Women should limit their alcohol intake to no more than 7 drinks/beers/glasses of wine (combined, not each!) per week. Moderation of alcohol intake to this level decreases your risk of breast cancer and liver damage. And of course, no recreational drugs are part of a healthy lifestyle.  And absolutely no smoking or even second hand smoke. Most people know smoking can cause heart and lung diseases, but did you know it also contributes to weakening of your bones? Aging of your skin?  Yellowing of your teeth and nails?  CALCIUM AND VITAMIN D:  Adequate intake of calcium and Vitamin D are recommended.  The recommendations for exact amounts of these supplements seem to change often, but generally speaking 600 mg of calcium (either carbonate or citrate) and 800 units of Vitamin D per day seems prudent. Certain women may benefit from higher intake of Vitamin D.  If you are among these women, your doctor will have told you during your visit.    PAP SMEARS:  Pap smears, to check for cervical cancer or precancers,  have traditionally been done yearly, although recent scientific advances have shown that most women can have pap smears less often.  However, every woman still should have a physical exam from her gynecologist every year. It will include a breast check, inspection of the vulva and vagina to check for abnormal growths or skin changes, a visual exam of the cervix, and then an exam to evaluate the size and shape of the uterus and  ovaries.  And after 42 years of age, a rectal exam is indicated to check for rectal cancers. We will also provide age appropriate advice regarding health maintenance, like when you should have certain vaccines, screening for sexually transmitted diseases, bone density testing, colonoscopy, mammograms, etc.   MAMMOGRAMS:  All women over 40 years old should have a yearly mammogram. Many facilities now offer a "3D" mammogram, which may cost around $50 extra out of pocket. If possible,  we recommend you accept the option to have the 3D mammogram performed.  It both reduces the number of women who will be called back for extra views which then turn out to be normal, and it is better than the routine mammogram at detecting truly abnormal areas.    COLONOSCOPY:  Colonoscopy to screen for colon cancer is recommended for all women at age 50.  We know, you hate the idea of the prep.  We agree, BUT, having colon cancer and not knowing it is worse!!  Colon cancer so often starts as a polyp that can be seen and removed at colonscopy, which can quite literally save your life!  And if your first colonoscopy is normal and you have no family history of colon cancer, most women don't have to have it again for 10 years.  Once every ten years, you can do something that may end up saving your life, right?  We will be happy to help you get it scheduled when you are ready.    Be sure to check your insurance coverage so you understand how much it will cost.  It may be covered as a preventative service at no cost, but you should check your particular policy.      Breast Self-Awareness Breast self-awareness means being familiar with how your breasts look and feel. It involves checking your breasts regularly and reporting any changes to your health care provider. Practicing breast self-awareness is important. A change in your breasts can be a sign of a serious medical problem. Being familiar with how your breasts look and feel allows  you to find any problems early, when treatment is more likely to be successful. All women should practice breast self-awareness, including women who have had breast implants. How to do a breast self-exam One way to learn what is normal for your breasts and whether your breasts are changing is to do a breast self-exam. To do a breast self-exam: Look for Changes  1. Remove all the clothing above your waist. 2. Stand in front of a mirror in a room with good lighting. 3. Put your hands on your hips. 4. Push your hands firmly downward. 5. Compare your breasts in the mirror. Look for differences between them (asymmetry), such as: ? Differences in shape. ? Differences in size. ? Puckers, dips, and bumps in one breast and not the other. 6. Look at each breast for changes in your skin, such as: ? Redness. ? Scaly areas. 7. Look for changes in your nipples, such as: ? Discharge. ? Bleeding. ? Dimpling. ? Redness. ? A change in position. Feel for Changes  Carefully feel your breasts for lumps and changes. It is best to do this while lying on your back on the floor and again while sitting or standing in the shower or tub with soapy water on your skin. Feel each breast in the following way:  Place the arm on the side of the breast you are examining above your head.  Feel your breast with the other hand.  Start in the nipple area and make  inch (2 cm) overlapping circles to feel your breast. Use the pads of your three middle fingers to do this. Apply light pressure, then medium pressure, then firm pressure. The light pressure will allow you to feel the tissue closest to the skin. The medium pressure will allow you to feel the tissue that is a little deeper. The firm pressure will allow you to feel the tissue close to the ribs.  Continue the overlapping circles, moving downward over the breast until you feel your ribs below your breast.  Move one finger-width toward the center of the body.  Continue to use the  inch (2 cm) overlapping circles to feel your breast as you move slowly up toward your collarbone.  Continue the up and down exam using all three pressures until you reach your armpit.  Write Down What You Find  Write down what is normal for each breast and any changes that you find. Keep a written record with breast changes or normal findings for each breast. By writing this information down, you do not need to depend only on memory for size, tenderness, or location. Write down where you are in your menstrual cycle, if you are still menstruating. If you are having trouble noticing differences in your breasts, do not get discouraged. With time you will become more familiar with the variations in your breasts and more comfortable with the exam. How often should I examine my breasts? Examine   your breasts every month. If you are breastfeeding, the best time to examine your breasts is after a feeding or after using a breast pump. If you menstruate, the best time to examine your breasts is 5-7 days after your period is over. During your period, your breasts are lumpier, and it may be more difficult to notice changes. When should I see my health care provider? See your health care provider if you notice:  A change in shape or size of your breasts or nipples.  A change in the skin of your breast or nipples, such as a reddened or scaly area.  Unusual discharge from your nipples.  A lump or thick area that was not there before.  Pain in your breasts.  Anything that concerns you.  This information is not intended to replace advice given to you by your health care provider. Make sure you discuss any questions you have with your health care provider. Document Released: 09/14/2005 Document Revised: 02/20/2016 Document Reviewed: 08/04/2015 Elsevier Interactive Patient Education  2018 Elsevier Inc.  

## 2018-09-13 NOTE — Telephone Encounter (Signed)
Spoke with patient, requesting to schedule Mirena IUD exchange, due 02/2019. IUD exchange scheduled for 02/15/19 at 10am with Dr. Talbert Nan. Orders previously placed, aware will be called with benefits prior to appt. Patient verbalizes understanding and is agreeable.   Routing to [employer].   Encounter closed.

## 2018-09-13 NOTE — Progress Notes (Signed)
42 y.o. G0P0000 Single White or Caucasian Not Hispanic or Latino female here for annual exam.  She has a mirena IUD, inserted in 6/15. No bleeding.  She is with the same partner, rare discomfort with intercourse, positional.   She has 2 herniated disc's in her cervical spine. She is on Neurontin and is seeing a Publishing rights manager.     No LMP recorded (lmp unknown). (Menstrual status: IUD).          Sexually active: Yes.    The current method of family planning is IUD--Mirena.    Exercising: Yes.    running, walking, occasional gym Smoker:  no  Health Maintenance: Pap: 08/09/17 Negative; HR HPV negative   05/2016 Normal History of abnormal Pap:  Yes, 05/2013 hx of LGSIL, had colposcopy but not treatment. MMG:  06/30/17 BIRADS 1 negative/density d BMD:   n/a Colonoscopy: n/a TDaP:  2018 Gardasil: interested    reports that she has never smoked. She has never used smokeless tobacco. She reports current alcohol use. She reports that she does not use drugs. Occasional ETOH. 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 Abnormal Pap smear of cervix 05/2013   --had colposcopy for LGSIL but no treatment to cervix  . ADHD   . Anxiety   . Arthritis   . Cancer ([place]) 1998   back  . Depression   . Fibroadenoma    Rt.Breast  . Hyperhidrosis 06/23/2017  . Hyperhidrosis   . Hypertension   . Melasma   . Obstructive sleep apnea   . Piriformis syndrome   . PTSD (post-traumatic stress disorder)   . Sacroiliitis ([place])   . Seasonal allergic rhinitis due to pollen 06/23/2017  . Vitamin D deficiency     Past Surgical History:  Procedure Laterality Date  . BREAST BIOPSY Right    benign  . BREAST SURGERY     breast biopsy  . COLPOSCOPY  05/2013   LGSIL--no treatment  . excision of melanoma  1998   --back  . HIP ARTHROSCOPY W/ LABRAL REPAIR Left 11/2013  . KNEE ARTHROSCOPY  02/2013     Current Outpatient Medications  Medication Sig Dispense Refill  . acidophilus (RISAQUAD) CAPS capsule Take by mouth daily.    Marland Kitchen ALPRAZolam (XANAX) 0.5 MG tablet Take 0.5 mg by mouth as needed for anxiety (For air travel.).    Marland Kitchen azelastine (ASTELIN) 0.1 % nasal spray Place 1 spray into both nostrils daily. Use in each nostril as directed 30 mL 3  . buPROPion (WELLBUTRIN XL) 150 MG 24 hr tablet Take 1 tablet (150 mg total) by mouth 3 (three) times daily. 270 tablet 1  . cholecalciferol (VITAMIN D3) 25 MCG (1000 UT) tablet Take 1,000 Units by mouth daily.    . clonazePAM (KLONOPIN) 0.5 MG tablet Take 1 tablet (0.5 mg total) by mouth 2 (two) times daily as needed for anxiety. 180 tablet 0  . Cyanocobalamin (VITAMIN B 12 PO) Take by mouth daily.     . DULoxetine (CYMBALTA) 30 MG capsule TAKE 1 CAPSULE 3 TIMES A   DAY 270 capsule 1  . Fluocin-Hydroquinone-Tretinoin (TRI-LUMA) 0.01-4-0.05 % CREA Apply daily as needed. 90 g 1  . fluticasone (FLONASE) 50 MCG/ACT nasal spray Place 1 spray into both nostrils daily. 16 g 2  . gabapentin (NEURONTIN) 300 MG capsule TAKE 1 CAPSULE 3 TIMES  DAILY AS NEEDED 270 capsule 1  . levonorgestrel (MIRENA) 20 MCG/24HR IUD 1 each by Intrauterine route once.    . meloxicam (MOBIC) 15 MG tablet TAKE 1 TABLET DAILY. 90 tablet 1  . montelukast (SINGULAIR) 10 MG tablet TAKE 1 TABLET AT BEDTIME 90 tablet 1  . Multiple Vitamins-Minerals (VITAMIN D3 COMPLETE PO) Take by mouth.    . nitroGLYCERIN (NITRODUR - DOSED IN MG/24 HR) 0.2 mg/hr patch Place 1/4 to 1/2 of a patch over affected region. Remove and replace once daily.  Slightly alter skin placement daily 90 patch 0  . olmesartan (BENICAR) 40 MG tablet Take 1 tablet (40 mg total) by mouth daily. 90 tablet 1  . terazosin (HYTRIN) 10 MG capsule TAKE 1 CAPSULE AT BEDTIME 90 capsule 1  . valACYclovir (VALTREX) 500 MG tablet TAKE 1 TABLET DAILY 90 tablet 1  . VYVANSE 70 MG capsule Take 1 capsule by mouth daily.  0   No  current facility-administered medications for this visit.     Family History  Problem Relation Age of Onset  . Breast cancer Maternal Aunt 53       A & W  . Breast cancer Maternal Grandmother 45       mets to brain  . Autoimmune disease Mother   . Anxiety disorder Father   . CAD Father   . Other Father        dyslipidemia  . Bipolar disorder Sister   . ADD / ADHD Sister   . Thyroid disease Maternal Grandfather   . Cancer Paternal Grandfather 64       dec colon ca  Maternal aunt with breast cancer in her late 4's, negative genetic testing Maternal Grandmother died of breast cancer at 45.  The patient has had negative genetic testing.  Paternal grandfather with colon cancer in his 57's.   Review of Systems  Constitutional: Negative.   HENT: Negative.   Eyes: Negative.   Respiratory: Negative.   Cardiovascular: Negative.   Gastrointestinal: Negative.   Endocrine: Negative.   Genitourinary: Negative.   Musculoskeletal: Negative.   Skin: Negative.   Allergic/Immunologic: Negative.   Neurological: Negative.   Hematological: Negative.   Psychiatric/Behavioral: Negative.     Exam:   BP 104/60 (BP Location: Left Arm, Patient Position: Sitting, Cuff Size: Normal)   Pulse 88   Resp 16   Ht 5' 5.25" (1.657 m)   Wt 188 lb (85.3 kg)   LMP  (LMP Unknown)   BMI 31.05 kg/m   Weight change: @WEIGHTCHANGE @ Height:   Height: 5' 5.25" (165.7 cm)  Ht Readings from Last 3 Encounters:  09/13/18 5' 5.25" (1.657 m)  08/23/18 5\' 5"  (1.651 m)  08/16/18 5\' 5"  (1.651 m)    General appearance: alert, cooperative and appears stated age Head: Normocephalic, without obvious abnormality, atraumatic Neck: no adenopathy, supple, symmetrical, trachea midline and thyroid normal to inspection and palpation Lungs: clear to auscultation bilaterally Cardiovascular: regular rate and rhythm Breasts: bilateral fibrocysitc breasts. In the right breast there is a smooth mobile lump at 6-7 o'clock,  ~1.5-2 cm a few cm from the areolar region. In the periphery of the left breast at 2 o'clock is a 2 cm oval shaped, smooth, mobile lump. Abdomen: soft, non-tender; non distended,  no masses,  no organomegaly Extremities: extremities normal, atraumatic, no cyanosis or edema Skin: Skin color, texture, turgor normal. No rashes or lesions Lymph nodes: Cervical, supraclavicular, and axillary nodes normal. No abnormal inguinal nodes palpated Neurologic: Grossly normal  Pelvic: External genitalia:  no lesions              Urethra:  normal appearing urethra with no masses, tenderness or lesions              Bartholins and Skenes: normal                 Vagina: normal appearing vagina with normal color and discharge, no lesions              Cervix: no lesions and IUD string 4 cm               Bimanual Exam:  Uterus:  normal size, contour, position, consistency, mobility, non-tender and anteverted              Adnexa: no mass, fullness, tenderness               Rectovaginal: Confirms               Anus:  normal sphincter tone, no lesions  Chaperone was present for exam.  A:  Well Woman exam  The patient has a FH of breast cancer, negative genetic testing (but relative also with negative testing)  Bilateral breast lumps  IUD, due for removal in June, 2020  P:   Will send genetics records here, she may be a candidate for breast MRI's  Mammogram is scheduled, will change to diagnostic imaging  Discussed breast self exam  Discussed calcium and vit D intake  Start gardasil series  Mirena removal and reinsertion in June

## 2018-09-13 NOTE — Progress Notes (Signed)
Patient scheduled while in office for bilateral Dx MMG and left and right breast US, if needed, at [facility] on 09/19/18 arriving at 1:20pm, appt at 1:40pm. Patient verbalizes understanding and is agreeable.

## 2018-09-13 NOTE — Progress Notes (Signed)
Jill Frank. Jill Frank, [place] [facility] [place] - 42 y.o. female MRN [id number]  Date of birth: [birth year]  Visit Date: 09/13/2018   PCP: Briscoe Deutscher, DO   Referred by: Briscoe Deutscher, DO   SUBJECTIVE:  Chief Complaint  Patient presents with  . Follow-up    L hip and back pain    HPI: Patient presents for follow-up of left hip and low back pain.  She is having mild symptoms at this time.  She has upcoming travel plans for the holidays.  She is using nitroglycerin as well as Tylenol for the lateral hip pain.  She has continued to perform her home therapeutic exercises as well as [employer].  She has had a referral placed but is not been seen by rheumatology at this time.  REVIEW OF SYSTEMS: Per HPI  HISTORY:  Prior history reviewed and updated per electronic medical record.  Social History   Occupational History  . Occupation: Programme researcher, broadcasting/film/video    Employer: [employer]  Tobacco Use  . Smoking status: Never Smoker  . Smokeless tobacco: Never Used  Substance and Sexual Activity  . Alcohol use: Yes    Comment: Social --1 per week  . Drug use: No  . Sexual activity: Yes    Birth control/protection: I.U.D.    Comment: Mirena IUD inserted 02/2014   Social History   Social History Narrative   Lives alone in a 2 story home.  No children.  Right handed.  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  Education: doctorate.      DATA OBTAINED & REVIEWED:  Recent Labs    12/14/17 1041  CALCIUM 9.3  AST 15  ALT 23  TSH 1.45   No problems updated. The 10-year ASCVD risk score Mikey Bussing DC Brooke Bonito., et al., 2013) is: 0.3%   Values used to calculate the score:     Age: 69 years     Sex: Female     Is Non-Hispanic African American: No     Diabetic: No     Tobacco smoker: No     Systolic Blood Pressure: 627 mmHg     Is BP treated: Yes     HDL Cholesterol: 95.1 mg/dL     Total Cholesterol: 207 mg/dL  OBJECTIVE:    VS:  HT:5' 5.25" (165.7 cm)   WT:187 lb 9.6 oz (85.1 kg)  BMI:30.99    BP:(!) 130/92  HR:84bpm  TEMP: ( )  RESP:96 %   PHYSICAL EXAM: Adult female. No acute distress.  Alert and appropriate. Good cervical and lumbar range of motion although there are functional limitations. Negative Spurling's compression test and Lhermitte's compression test.   Upper extremity lower extremity strength is 5/5 in all myotomes. Normal sensation Significant anterior chain dominant posture.    ASSESSMENT  1. Osteoarthritis of spine with radiculopathy, cervical region   2. Elevated antinuclear antibody (ANA) level   3. Somatic dysfunction of lumbar region   4. Somatic dysfunction of pelvis region   5. Somatic dysfunction of sacral region   6. Pain in left hip   7. Disease of spinal cord [facility])     PLAN:  Pertinent additional documentation may be included in corresponding procedure notes, imaging studies, problem based documentation and patient instructions.  Procedures:  Osteopathic manipulation was performed today based on physical exam findings.  Please see procedure note for further information including Osteopathic Exam findings      Medications:  No orders  of the defined types were placed in this encounter.   Discussion/Instructions: No problem-specific Assessment & Plan notes found for this encounter.  Osteopathic manipulation was performed today based on physical exam findings.  Patient has responded well to osteopathic manipulation previously the prior manipulation did not provide permanent long lasting relief.  The patient does feel as though there was significant benefit to the prior manipulation and they wished for repeat manipulation today.  They understand that home therapeutic exercises are critical part of the healing/treatment process and will continue with self treatment between now and their next visit as outlined.  The patient understands that the frequency of visits is meant  to provide a stimulus to promote the body's own ability to heal and is not meant to be the sole means for improvement in their symptoms.  Ultimately long discussion today regarding the options for her cervical spine and underlying possible rheumatologic disease.  I do agree that being seen by rheumatology is likely worthwhile at the same time nondiagnostic based on current laboratory values.  Neurosurgical consultation is coming up for cervical spine and this was avoided today from the manipulation standpoint.  Ultimately surgical intervention may be required and this was discussed in detail.  >50% of this 25 minute visit spent in direct patient counseling and/or coordination of care.  Discussion was focused on education regarding the in discussing the pathoetiology and anticipated clinical course of the above condition.  Return if symptoms worsen or fail to improve.          Gerda Diss, [facility] Physician

## 2018-09-14 ENCOUNTER — Telehealth: Payer: Self-pay | Admitting: Family Medicine

## 2018-09-14 NOTE — Telephone Encounter (Signed)
Demo sheet faxed

## 2018-09-14 NOTE — Telephone Encounter (Signed)
See note

## 2018-09-14 NOTE — Telephone Encounter (Signed)
Copied from [place] [id number]. Topic: Referral - Question >> Sep 14, 2018  9:04 AM Keene Breath wrote: Reason for CRM: [facility] called requesting a copy of the patient's demographics be faxed to their office, [place].  Fax# [phone].

## 2018-09-15 ENCOUNTER — Telehealth: Payer: Self-pay | Admitting: Obstetrics and Gynecology

## 2018-09-15 NOTE — Telephone Encounter (Signed)
Please let the patient know that I got a copy of her genetic consult. Her risk of breast cancer is less than 20%, she doesn't qualify for yearly breast MRI. Her risk is up to 18% she could do the abrbeviated MRI yearly, which is an approximately 400$ cost. It is being recommended for women with a risk of 15-19%. Up to her.  She should be doing yearly skin checks.

## 2018-09-16 ENCOUNTER — Other Ambulatory Visit: Payer: Self-pay | Admitting: Neurosurgery

## 2018-09-16 DIAGNOSIS — M5412 Radiculopathy, cervical region: Secondary | ICD-10-CM

## 2018-09-19 ENCOUNTER — Ambulatory Visit
Admission: RE | Admit: 2018-09-19 | Discharge: 2018-09-19 | Disposition: A | Discharge status: Home / Self Care | Payer: BC Managed Care – PPO | Source: Ambulatory Visit | Attending: Obstetrics and Gynecology | Admitting: Obstetrics and Gynecology

## 2018-09-19 DIAGNOSIS — N6313 Unspecified lump in the right breast, lower outer quadrant: Secondary | ICD-10-CM

## 2018-09-19 DIAGNOSIS — N6321 Unspecified lump in the left breast, upper outer quadrant: Secondary | ICD-10-CM

## 2018-09-19 NOTE — Telephone Encounter (Signed)
Spoke with patient. Advised of message as seen below from [place]. Patient verbalizes understanding. States that she would like to think about this and return call if she would like to proceed. Encounter closed.

## 2018-09-25 ENCOUNTER — Other Ambulatory Visit: Payer: Self-pay | Admitting: Family Medicine

## 2018-09-25 DIAGNOSIS — F331 Major depressive disorder, recurrent, moderate: Secondary | ICD-10-CM

## 2018-09-26 ENCOUNTER — Other Ambulatory Visit: Payer: Self-pay | Admitting: Obstetrics and Gynecology

## 2018-09-26 DIAGNOSIS — N63 Unspecified lump in unspecified breast: Secondary | ICD-10-CM

## 2018-09-29 ENCOUNTER — Encounter

## 2018-09-29 ENCOUNTER — Ambulatory Visit: Payer: BC Managed Care – PPO | Admitting: Obstetrics and Gynecology

## 2018-09-30 ENCOUNTER — Ambulatory Visit: Payer: BC Managed Care – PPO

## 2018-09-30 ENCOUNTER — Ambulatory Visit
Admission: RE | Admit: 2018-09-30 | Discharge: 2018-09-30 | Disposition: A | Discharge status: Home / Self Care | Payer: BC Managed Care – PPO | Source: Ambulatory Visit | Attending: Obstetrics and Gynecology | Admitting: Obstetrics and Gynecology

## 2018-09-30 ENCOUNTER — Other Ambulatory Visit: Payer: Self-pay | Admitting: Obstetrics and Gynecology

## 2018-09-30 DIAGNOSIS — N63 Unspecified lump in unspecified breast: Secondary | ICD-10-CM

## 2018-10-04 ENCOUNTER — Ambulatory Visit
Admission: RE | Admit: 2018-10-04 | Discharge: 2018-10-04 | Disposition: A | Discharge status: Home / Self Care | Payer: BC Managed Care – PPO | Source: Ambulatory Visit | Attending: Neurosurgery | Admitting: Neurosurgery

## 2018-10-04 ENCOUNTER — Telehealth: Payer: Self-pay | Admitting: Emergency Medicine

## 2018-10-04 DIAGNOSIS — M5412 Radiculopathy, cervical region: Secondary | ICD-10-CM

## 2018-10-04 MED ORDER — IOPAMIDOL (ISOVUE-M 300) INJECTION 61%
1.0000 mL | Freq: Once | INTRAMUSCULAR | Status: AC | PRN
  Administered 2018-10-04: 1 mL via EPIDURAL

## 2018-10-04 MED ORDER — TRIAMCINOLONE ACETONIDE 40 MG/ML IJ SUSP (RADIOLOGY)
60.0000 mg | Freq: Once | INTRAMUSCULAR | Status: AC
  Administered 2018-10-04: 60 mg via EPIDURAL

## 2018-10-04 NOTE — Discharge Instructions (Signed)

## 2018-10-04 NOTE — Telephone Encounter (Signed)
-----   Message from Salvadore Dom, MD sent at 09/20/2018 11:08 AM EST ----- Please keep on hold until other images are available. Please also schedule her for a f/u breast check in 3 months.

## 2018-10-05 ENCOUNTER — Encounter: Payer: Self-pay | Admitting: Obstetrics and Gynecology

## 2018-10-05 ENCOUNTER — Telehealth: Payer: Self-pay | Admitting: Obstetrics and Gynecology

## 2018-10-05 ENCOUNTER — Encounter

## 2018-10-05 ENCOUNTER — Ambulatory Visit: Payer: BC Managed Care – PPO | Admitting: Neurology

## 2018-10-05 DIAGNOSIS — Z1231 Encounter for screening mammogram for malignant neoplasm of breast: Secondary | ICD-10-CM

## 2018-10-05 NOTE — Telephone Encounter (Signed)
Spoke with patient, advised order placed for abbreviated breast MRI at [place]. Reviewed out of pocket cost with patient, patient request to proceed. Patient aware she will be contacted directly by [place] to schedule. Is aware to return call if any additional questions or assistance needed. Patient verbalizes understanding and is agreeable.   Encounter closed.

## 2018-10-05 NOTE — Telephone Encounter (Signed)
Yes, ordered signed.

## 2018-10-05 NOTE — Telephone Encounter (Signed)
Patient sent the following message through [place]. Routing to triage to assist patient with request.   Dr. Talbert Nan,  Based on the Mammogram, [place], and Biopsy results recently as well as my past genetic testing results which I forwarded to you, I was wondering if you would recommend that I have the abbreviated screening breast MRI? Thanks for your thoughts related to this. I did discuss it with Dr. Jimmye Norman as well.

## 2018-10-05 NOTE — Telephone Encounter (Signed)
Per review of telephone encounter dated 09/15/18. Patients lifetime risk of breast cancer 18%, abbreviated MRI recommended.   Order pended for abbreviated breast MRI.   Dr. Talbert Nan -ok to proceed?

## 2018-10-06 NOTE — Telephone Encounter (Signed)
Pt is out of hold.  Agreeable to breast check.  Scheduled for 12/15/2018.   Encounter closed.

## 2018-10-10 ENCOUNTER — Telehealth: Payer: Self-pay | Admitting: Obstetrics and Gynecology

## 2018-10-10 MED ORDER — VALACYCLOVIR HCL 500 MG PO TABS: ORAL_TABLET | ORAL | 3 refills | Status: AC

## 2018-10-10 MED ORDER — VALACYCLOVIR HCL 500 MG PO TABS: ORAL_TABLET | ORAL | 3 refills | Status: DC

## 2018-10-10 NOTE — Telephone Encounter (Signed)
Ok for Valtrex 500 mg daily.  Take one po bid x 3 days for an outbreak.  Dispense: 110, RF 3.

## 2018-10-10 NOTE — Telephone Encounter (Signed)
Spoke with patient. Hx of HSV. Taking Valtrex 500 mg po daily.  Has had recent stress and illness. Does not usually have an outbreak but has one now.  Has a prescription for 90 tablets supply but does not have any additional tablets for an outbreak when one does occur.   When she has an outbreak she takes Valtrex 500 mg po BID x 3 days.   Would like to have Dr. Talbert Nan manage her Rx for Valtrex and not PCP.  Requests Rx for 90 days to [facility] to allow for additional tablets if needed for outbreak.    Advised will review with covering provider and return call. Pt agreeable.

## 2018-10-10 NOTE — Telephone Encounter (Signed)
Rx sent to [facility].   Call to patient and left message to return call.  No details left.

## 2018-10-10 NOTE — Telephone Encounter (Signed)
Spoke with patinet and notified of Rx change and instructions.  Pt agreeable.  Encounter closed.

## 2018-10-10 NOTE — Telephone Encounter (Signed)
Patient is currently experiencing a herpes outbreak. Patient stated that she has medication, but it will not be enough to last.

## 2018-10-29 ENCOUNTER — Encounter: Payer: Self-pay | Admitting: Family Medicine

## 2018-10-30 MED ORDER — METRONIDAZOLE 500 MG PO TABS: 500.0000 mg | ORAL_TABLET | Freq: Two times a day (BID) | ORAL | 3 refills | Status: DC

## 2018-11-04 ENCOUNTER — Encounter: Payer: Self-pay | Admitting: Sports Medicine

## 2018-11-07 ENCOUNTER — Ambulatory Visit: Payer: BC Managed Care – PPO | Admitting: Sports Medicine

## 2018-11-09 ENCOUNTER — Other Ambulatory Visit (HOSPITAL_COMMUNITY)
Admission: RE | Admit: 2018-11-09 | Discharge: 2018-11-09 | Disposition: A | Discharge status: Home / Self Care | Payer: BC Managed Care – PPO | Source: Ambulatory Visit | Attending: Family Medicine | Admitting: Family Medicine

## 2018-11-09 ENCOUNTER — Ambulatory Visit: Payer: BC Managed Care – PPO | Admitting: Family Medicine

## 2018-11-09 ENCOUNTER — Encounter: Payer: Self-pay | Admitting: Family Medicine

## 2018-11-09 VITALS — BP 108/64 | HR 88 | Temp 98.6°F | Ht 65.0 in | Wt 187.6 lb

## 2018-11-09 DIAGNOSIS — E538 Deficiency of other specified B group vitamins: Secondary | ICD-10-CM | POA: Insufficient documentation

## 2018-11-09 DIAGNOSIS — R5383 Other fatigue: Secondary | ICD-10-CM | POA: Insufficient documentation

## 2018-11-09 DIAGNOSIS — Z113 Encounter for screening for infections with a predominantly sexual mode of transmission: Secondary | ICD-10-CM | POA: Diagnosis not present

## 2018-11-09 DIAGNOSIS — Z79899 Other long term (current) drug therapy: Secondary | ICD-10-CM

## 2018-11-09 DIAGNOSIS — N760 Acute vaginitis: Secondary | ICD-10-CM

## 2018-11-09 DIAGNOSIS — Z13220 Encounter for screening for lipoid disorders: Secondary | ICD-10-CM | POA: Diagnosis not present

## 2018-11-09 DIAGNOSIS — Z23 Encounter for immunization: Secondary | ICD-10-CM

## 2018-11-09 DIAGNOSIS — R300 Dysuria: Secondary | ICD-10-CM

## 2018-11-09 DIAGNOSIS — F418 Other specified anxiety disorders: Secondary | ICD-10-CM

## 2018-11-09 DIAGNOSIS — B9689 Other specified bacterial agents as the cause of diseases classified elsewhere: Secondary | ICD-10-CM

## 2018-11-09 LAB — CBC WITH DIFFERENTIAL/PLATELET
Basophils Absolute: 0 10*3/uL (ref 0.0–0.1)
Basophils Relative: 0.6 % (ref 0.0–3.0)
Eosinophils Absolute: 0.2 10*3/uL (ref 0.0–0.7)
Eosinophils Relative: 2.4 % (ref 0.0–5.0)
HCT: 39.3 % (ref 36.0–46.0)
Hemoglobin: 13.3 g/dL (ref 12.0–15.0)
Lymphocytes Relative: 26.8 % (ref 12.0–46.0)
Lymphs Abs: 1.9 10*3/uL (ref 0.7–4.0)
MCHC: 33.8 g/dL (ref 30.0–36.0)
MCV: 96 fl (ref 78.0–100.0)
Monocytes Absolute: 0.7 10*3/uL (ref 0.1–1.0)
Monocytes Relative: 9.5 % (ref 3.0–12.0)
Neutro Abs: 4.3 10*3/uL (ref 1.4–7.7)
Neutrophils Relative %: 60.7 % (ref 43.0–77.0)
Platelets: 275 10*3/uL (ref 150.0–400.0)
RBC: 4.1 Mil/uL (ref 3.87–5.11)
RDW: 13.6 % (ref 11.5–15.5)
WBC: 7 10*3/uL (ref 4.0–10.5)

## 2018-11-09 LAB — LIPID PANEL
Cholesterol: 211 mg/dL — ABNORMAL HIGH (ref 0–200)
HDL: 74.8 mg/dL (ref >39.00)
LDL Cholesterol: 122 mg/dL — ABNORMAL HIGH (ref 0–99)
NonHDL: 136.13
Total CHOL/HDL Ratio: 3
Triglycerides: 73 mg/dL (ref 0.0–149.0)
VLDL: 14.6 mg/dL (ref 0.0–40.0)

## 2018-11-09 LAB — COMPREHENSIVE METABOLIC PANEL
ALT: 43 U/L — ABNORMAL HIGH (ref 0–35)
AST: 26 U/L (ref 0–37)
Albumin: 4.1 g/dL (ref 3.5–5.2)
Alkaline Phosphatase: 43 U/L (ref 39–117)
BUN: 22 mg/dL (ref 6–23)
CO2: 27 mEq/L (ref 19–32)
Calcium: 9.1 mg/dL (ref 8.4–10.5)
Chloride: 106 mEq/L (ref 96–112)
Creatinine, Ser: 0.67 mg/dL (ref 0.40–1.20)
GFR: 96.16 mL/min (ref >60.00)
Glucose, Bld: 91 mg/dL (ref 70–99)
Potassium: 4.1 mEq/L (ref 3.5–5.1)
Sodium: 140 mEq/L (ref 135–145)
Total Bilirubin: 0.4 mg/dL (ref 0.2–1.2)
Total Protein: 6.2 g/dL (ref 6.0–8.3)

## 2018-11-09 LAB — POC URINALSYSI DIPSTICK (AUTOMATED)
Bilirubin, UA: NEGATIVE
Blood, UA: NEGATIVE
Glucose, UA: NEGATIVE
Ketones, UA: NEGATIVE
Leukocytes, UA: NEGATIVE
Nitrite, UA: NEGATIVE
Protein, UA: NEGATIVE
Spec Grav, UA: 1.02 (ref 1.010–1.025)
Urobilinogen, UA: 0.2 E.U./dL
pH, UA: 7 (ref 5.0–8.0)

## 2018-11-09 LAB — HEMOGLOBIN A1C: Hgb A1c MFr Bld: 5.2 % (ref 4.6–6.5)

## 2018-11-09 LAB — TSH: TSH: 1.5 u[IU]/mL (ref 0.35–4.50)

## 2018-11-09 NOTE — Progress Notes (Signed)
Jill Frank is a 43 y.o. female is here for follow up.  History of Present Illness:   HPI: Stressed. Work with unrealistic expectations per her report. Interviewing for new position. Broke up with long time boyfriend. Dating. Wants STI testing today. No time for self-care. Sees Dr. Toy Care for Psych. Breast cancer risk high. Insurance will not cover breast MRI. Followed by GYN.   There are no preventive care reminders to display for this patient. Depression screen [facility] 2/9 02/25/2018 06/21/2017  Decreased Interest 3 2  Down, Depressed, Hopeless 1 1  PHQ - 2 Score 4 3  Altered sleeping 1 2  Tired, decreased energy 3 3  Change in appetite 1 2  Feeling bad or failure about yourself  1 1  Trouble concentrating 1 1  Moving slowly or fidgety/restless 0 1  Suicidal thoughts 0 0  PHQ-9 Score 11 13  Difficult doing work/chores Somewhat difficult -   PMHx, SurgHx, SocialHx, FamHx, Medications, and Allergies were reviewed in the Visit Navigator and updated as appropriate.   Patient Active Problem List   Diagnosis Date Noted  . Elevated antinuclear antibody (ANA) level 08/27/2018  . Chronic left-sided low back pain without sciatica 08/27/2018  . B12 deficiency 07/08/2018  . IUD (intrauterine device) in place 12/19/2017  . Greater trochanteric pain syndrome of left lower extremity 10/29/2017  . Osteoarthritis of spine with radiculopathy, cervical region 10/29/2017  . ADHD, on Vyvanse, followed by Dr. Toy Care 08/02/2017  . OSA (obstructive sleep apnea) 07/21/2017  . Polyarthralgia 06/28/2017  . Left wrist pain 06/28/2017  . Seasonal allergic rhinitis due to pollen, on Singulair, Flonase, and Astelin 06/23/2017  . Hyperhidrosis 06/23/2017  . Hypertension, on Benicar and Hytrin   . Depression    Social History   Tobacco Use  . Smoking status: Never Smoker  . Smokeless tobacco: Never Used  Substance Use Topics  . Alcohol use: Yes    Comment: Social --1 per week  . Drug use: No    Current Medications and Allergies   .  acidophilus (RISAQUAD) CAPS capsule, Take by mouth daily., Disp: , Rfl:  .  ALPRAZolam (XANAX) 0.5 MG tablet, Take 0.5 mg by mouth as needed for anxiety (For air travel.)., Disp: , Rfl:  .  azelastine (ASTELIN) 0.1 % nasal spray, Place 1 spray into both nostrils daily. Use in each nostril as directed, Disp: 30 mL, Rfl: 3 .  buPROPion (WELLBUTRIN XL) 150 MG 24 hr tablet, TAKE 1 (150 MG TOTAL)TABLET3 TIMES A DAY, Disp: 270 tablet, Rfl: 1 .  cholecalciferol (VITAMIN D3) 25 MCG (1000 UT) tablet, Take 1,000 Units by mouth daily., Disp: , Rfl:  .  clonazePAM (KLONOPIN) 0.5 MG tablet, Take 1 tablet (0.5 mg total) by mouth 2 (two) times daily as needed for anxiety., Disp: 180 tablet, Rfl: 0 .  Cyanocobalamin (VITAMIN B 12 PO), Take by mouth daily. , Disp: , Rfl:  .  DULoxetine (CYMBALTA) 30 MG capsule, TAKE 1 CAPSULE 3 TIMES A   DAY, Disp: 270 capsule, Rfl: 1 .  Fluocin-Hydroquinone-Tretinoin (TRI-LUMA) 0.01-4-0.05 % CREA, Apply daily as needed., Disp: 90 g, Rfl: 1 .  fluticasone (FLONASE) 50 MCG/ACT nasal spray, Place 1 spray into both nostrils daily., Disp: 16 g, Rfl: 2 .  gabapentin (NEURONTIN) 300 MG capsule, TAKE 1 CAPSULE 3 TIMES     DAILY AS NEEDED, Disp: 270 capsule, Rfl: 1 .  levonorgestrel (MIRENA) 20 MCG/24HR IUD, 1 each by Intrauterine route once., Disp: , Rfl:  .  meloxicam (  MOBIC) 15 MG tablet, TAKE 1 TABLET DAILY., Disp: 90 tablet, Rfl: 1 .  metroNIDAZOLE (FLAGYL) 500 MG tablet, Take 1 tablet (500 mg total) by mouth 2 (two) times daily., Disp: 14 tablet, Rfl: 3 .  montelukast (SINGULAIR) 10 MG tablet, TAKE 1 TABLET AT BEDTIME, Disp: 90 tablet, Rfl: 1 .  Multiple Vitamins-Minerals (VITAMIN D3 COMPLETE PO), Take by mouth., Disp: , Rfl:  .  nitroGLYCERIN (NITRODUR - DOSED IN MG/24 HR) 0.2 mg/hr patch, Place 1/4 to 1/2 of a patch over affected region. Remove and replace once daily.  Slightly alter skin placement daily, Disp: 90 patch, Rfl: 0 .   olmesartan (BENICAR) 40 MG tablet, Take 1 tablet (40 mg total) by mouth daily., Disp: 90 tablet, Rfl: 1 .  terazosin (HYTRIN) 10 MG capsule, TAKE 1 CAPSULE AT BEDTIME, Disp: 90 capsule, Rfl: 1 .  valACYclovir (VALTREX) 500 MG tablet, 1 tablet po daily or take one tablet po bid x 3 days for outbreak recurrence., Disp: 110 tablet, Rfl: 3 .  VYVANSE 70 MG capsule, Take 1 capsule by mouth daily., Disp: , Rfl: 0   Allergies  Allergen Reactions  . Doxycycline Hives  . Erythromycin Hives  . Penicillins Hives   Review of Systems   Pertinent items are noted in the HPI. Otherwise, a complete ROS is negative.  Vitals   Vitals:   11/09/18 0906  BP: 108/64  Pulse: 88  Temp: 98.6 F (37 C)  TempSrc: Oral  Weight: 187 lb 9.6 oz (85.1 kg)  Height: 5\' 5"  (1.651 m)     Body mass index is 31.22 kg/m.  Physical Exam   Physical Exam Vitals signs and nursing note reviewed.  HENT:     Head: Normocephalic and atraumatic.  Eyes:     Pupils: Pupils are equal, round, and reactive to light.  Neck:     Musculoskeletal: Normal range of motion and neck supple.  Cardiovascular:     Rate and Rhythm: Normal rate and regular rhythm.     Heart sounds: Normal heart sounds.  Pulmonary:     Effort: Pulmonary effort is normal.  Abdominal:     Palpations: Abdomen is soft.  Skin:    General: Skin is warm.  Psychiatric:        Behavior: Behavior normal.    Assessment and Plan   Jill Frank was seen today for follow-up.  Diagnoses and all orders for this visit:  Bacterial vaginitis, recurrent Comments: Discussed options. See AVS.   B12 deficiency -     CBC with Differential/Platelet  Fatigue, unspecified type  Routine screening for STI (sexually transmitted infection) -     Cervicovaginal ancillary only( [place]) -     HIV Antibody (routine testing w rflx) -     RPR  Screening for lipid disorders -     Lipid panel  Situational anxiety  Dysuria -     POCT Urinalysis Dipstick  (Automated) -     Urine Culture -     CBC with Differential/Platelet -     Comprehensive metabolic panel  Need for HPV vaccination -     HPV 9-valent vaccine,Recombinat  Medication management -     Hemoglobin A1c -     TSH -     QuantiFERON-TB Gold Plus    . Orders and follow up as documented in [place], reviewed diet, exercise and weight control, cardiovascular risk and specific lipid/LDL goals reviewed, reviewed medications and side effects in detail.  . Reviewed expectations re: course  of current medical issues. . Outlined signs and symptoms indicating need for more acute intervention. . Patient verbalized understanding and all questions were answered. . Patient received an After Visit Summary.  Briscoe Deutscher, DO [place] 11/10/2018

## 2018-11-10 LAB — URINE CULTURE
MICRO NUMBER:: 186029
SPECIMEN QUALITY:: ADEQUATE

## 2018-11-10 LAB — HIV ANTIBODY (ROUTINE TESTING W REFLEX): HIV 1&2 Ab, 4th Generation: NONREACTIVE

## 2018-11-10 LAB — RPR: RPR Ser Ql: NONREACTIVE

## 2018-11-11 ENCOUNTER — Telehealth: Payer: Self-pay

## 2018-11-11 LAB — QUANTIFERON-TB GOLD PLUS
Mitogen-NIL: 6.9 IU/mL
NIL: 0.02 IU/mL
QuantiFERON-TB Gold Plus: NEGATIVE
TB1-NIL: 0 IU/mL
TB2-NIL: 0 IU/mL

## 2018-11-11 LAB — CERVICOVAGINAL ANCILLARY ONLY
Bacterial vaginitis: NEGATIVE
Candida vaginitis: POSITIVE — AB
Chlamydia: NEGATIVE
Neisseria Gonorrhea: NEGATIVE
Trichomonas: NEGATIVE

## 2018-11-11 NOTE — Telephone Encounter (Signed)
PPW received by [facility] placed in your box for completion

## 2018-11-12 MED ORDER — FLUCONAZOLE 150 MG PO TABS: ORAL_TABLET | ORAL | 2 refills | Status: DC

## 2018-11-12 NOTE — Addendum Note (Signed)
Addended by: Briscoe Deutscher R on: 11/12/2018 06:54 PM   Modules accepted: Orders

## 2018-11-14 ENCOUNTER — Ambulatory Visit: Payer: BC Managed Care – PPO | Admitting: Sports Medicine

## 2018-11-14 ENCOUNTER — Encounter: Payer: Self-pay | Admitting: Sports Medicine

## 2018-11-14 VITALS — BP 110/78 | HR 97 | Ht 65.0 in | Wt 185.6 lb

## 2018-11-14 DIAGNOSIS — M545 Low back pain, unspecified: Secondary | ICD-10-CM

## 2018-11-14 DIAGNOSIS — G8929 Other chronic pain: Secondary | ICD-10-CM

## 2018-11-14 DIAGNOSIS — M9905 Segmental and somatic dysfunction of pelvic region: Secondary | ICD-10-CM

## 2018-11-14 DIAGNOSIS — M25552 Pain in left hip: Secondary | ICD-10-CM

## 2018-11-14 DIAGNOSIS — M9902 Segmental and somatic dysfunction of thoracic region: Secondary | ICD-10-CM | POA: Diagnosis not present

## 2018-11-14 DIAGNOSIS — M4722 Other spondylosis with radiculopathy, cervical region: Secondary | ICD-10-CM | POA: Diagnosis not present

## 2018-11-14 DIAGNOSIS — M9903 Segmental and somatic dysfunction of lumbar region: Secondary | ICD-10-CM

## 2018-11-14 NOTE — Telephone Encounter (Signed)
Completed. 11/14/2018

## 2018-11-14 NOTE — Progress Notes (Signed)
Jill Frank. Jill Frank, [place] at [place] - 43 y.o. female MRN [id number]  Date of birth: [birth year]  Visit Date: November 14, 2018  PCP: Briscoe Deutscher, DO   Referred by: Briscoe Deutscher, DO  SUBJECTIVE:  Chief Complaint  Patient presents with  . Follow-up    Neck and L hip pain.  Towel stretches.  Archie Balboa.  Nitro for L hip.  OMT    HPI: Patient is here for follow-up of her neck, back and left hip pain.  She has undergone a C7 epidural steroid injection with some improvement.  She is also had medication changes and is taking increased levels of clonazepam.  She has had continued stresses and is actually going to be changing jobs and moving to [place] in May.  Her neck has been persistently bothersome and she is scheduled for a ACDF of her cervical spine on March 17.  Her left hip and low back are persistently tight.  She is continued to have increased symptoms with this and is interested in repeat manipulation for her thoracic spine and lumbar spine.  REVIEW OF SYSTEMS: Denies fevers, chills, recent weight gain or weight loss.  No night sweats.  Pt denies any change in bowel or bladder habits, muscle weakness, numbness or falls associated with this pain. She has had nighttime awakenings for multiple reasons.  HISTORY:  Prior history reviewed and updated per electronic medical record.  Patient Active Problem List   Diagnosis Date Noted  . Elevated antinuclear antibody (ANA) level 08/27/2018  . Chronic left-sided low back pain without sciatica 08/27/2018  . B12 deficiency 07/08/2018  . IUD (intrauterine device) in place 12/19/2017  . Greater trochanteric pain syndrome of left lower extremity 10/29/2017  . Osteoarthritis of spine with radiculopathy, cervical region 10/29/2017    08/18/2018 - MRI Cervical Spine: 1. On the symptomatic left side there is advanced degenerative foraminal impingement at  C6-7 and mild-to-moderate foraminal stenosis at C5-6. 2. C3-4 and C5-6 disc protrusions contacting the ventral cord with mild cord flattening at C5-6. 3. Multilevel degenerative facet spurring asymmetric to the right.  06/28/2017 XR C-spine IMPRESSION: No radiographic evidence of acute fracture or malalignment of cervical spine. Multilevel moderate to advanced degenerative disc disease with endplate changes, anterior osteophyte production, and uncovertebral joint disease. No significant left-sided foraminal narrowing, with early right-sided foraminal narrowing at C2-C3.   . ADHD, on Vyvanse, followed by Dr. Toy Care 08/02/2017  . OSA (obstructive sleep apnea) 07/21/2017    Unable to tolerate CPAP or oral appliance. Sleep study also revealed sleep disorders - decreased REM and RLS.    . Polyarthralgia 06/28/2017  . Left wrist pain 06/28/2017  . Seasonal allergic rhinitis due to pollen, on Singulair, Flonase, and Astelin 06/23/2017  . Hyperhidrosis 06/23/2017  . Hypertension, on Benicar and Hytrin   . Depression    Social History   Occupational History  . Occupation: Programme researcher, broadcasting/film/video    Employer: [employer]  Tobacco Use  . Smoking status: Never Smoker  . Smokeless tobacco: Never Used  Substance and Sexual Activity  . Alcohol use: Yes    Comment: Social --1 per week  . Drug use: No  . Sexual activity: Yes    Birth control/protection: I.U.D.    Comment: Mirena IUD inserted 02/2014   Social History   Social History Narrative   Lives alone in a 2 story home.  No children.  Right handed.  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  Education: doctorate.     OBJECTIVE:  VS:  HT:5\' 5"  (165.1 cm)   WT:185 lb 9.6 oz (84.2 kg)  BMI:30.89    BP:110/78  HR:97bpm  TEMP: ( )  RESP:96 %   PHYSICAL EXAM: Adult female. No acute distress.  Alert and appropriate. Thoracic spine is rigid with functional limitations in her range of motion.  Lumbar spine with functional rotation as per exam.   Negative straight leg raise bilaterally.  Right anterior innominate.  Lower extremity strength is 5/5 in all myotomes.  Sensation is intact light touch.   ASSESSMENT:   1. Chronic left-sided low back pain without sciatica   2. Greater trochanteric pain syndrome of left lower extremity   3. Osteoarthritis of spine with radiculopathy, cervical region   4. Somatic dysfunction of thoracic region   5. Somatic dysfunction of lumbar region   6. Somatic dysfunction of pelvis region     PROCEDURES:  PROCEDURE NOTE: OSTEOPATHIC MANIPULATION  The decision today to treat with Osteopathic Manipulative Therapy (OMT) was based on physical exam findings. Verbal consent was obtained following a discussion with the patient regarding the of risks, benefits and potential side effects, including an acute pain flare,post manipulation soreness and need for repeat treatments.   Contraindications to OMT: NONE Manipulation was performed as below: [facility] Treated & Osteopathic Exam Findings THORACIC SPINE:  T6 - 8 Neutral, rotated LEFT, sidebent RIGHT LUMBAR SPINE:  L3 ERS LEFT (Extended, Rotated & Sidebent) PELVIS:  Right psoas spasm Right anterior innonimate  OMT Techniques Used: HVLA muscle energy myofascial release  The patient tolerated the treatment well and reported Improved symptoms following treatment today. Patient was given medications, exercises, stretches and lifestyle modifications per AVS and verbally.      PLAN:  Pertinent additional documentation may be included in corresponding procedure notes, imaging studies, problem based documentation and patient instructions.  No problem-specific Assessment & Plan notes found for this encounter.  Agree likely prudent to undergo the cervical decompression given the findings.  Likely her rheumatologic evaluation was nonrevealing and this is likely not a rheumatologic condition just multiple polyarthralgia joint involvement from osteo-arthritis/wear  and tear.  We did discuss options for natural anti-inflammatories and other nutraceuticals per AVS.  Continue previously prescribed home exercise program.   Osteopathic manipulation was performed today based on physical exam findings.  Patient has responded well to osteopathic manipulation previously the prior manipulation did not provide permanent long lasting relief.  The patient does feel as though there was significant benefit to the prior manipulation and they wished for repeat manipulation today.  They understand that home therapeutic exercises are critical part of the healing/treatment process and will continue with self treatment between now and their next visit as outlined.  The patient understands that the frequency of visits is meant to provide a stimulus to promote the body's own ability to heal and is not meant to be the sole means for improvement in their symptoms.  Activity modifications and the importance of avoiding exacerbating activities (limiting pain to no more than a 4 / 10 during or following activity) recommended and discussed.  Discussed red flag symptoms that warrant earlier emergent evaluation and patient voices understanding.   Return in about 4 weeks (around 12/12/2018) for consideration of repeat Osteopathic Manipulation.          Gerda Diss, [facility] Physician

## 2018-11-14 NOTE — Patient Instructions (Signed)
Everette Rank, DO at [place] went to med school with me.  He's a Family Med/Sports Med provider in [place].  [phone]  Tumeric - 500mg  by mouth twice daily Fish Oil Supplements (Omega-3) - 2000mg  daily  Try Glucosamine/Chondrotin just to see if it seems to help

## 2018-11-22 ENCOUNTER — Telehealth: Payer: Self-pay | Admitting: Obstetrics and Gynecology

## 2018-11-22 ENCOUNTER — Encounter: Payer: Self-pay | Admitting: Obstetrics and Gynecology

## 2018-11-22 NOTE — Telephone Encounter (Signed)
Spoke with patient. Mirena IUD placed 02/2014, is moving [place], requesting to move appointment up.   IUD exchange rescheduled for 01/23/19 at 2pm with Dr. Talbert Nan. Order previously placed for precert. Advised Dr. Talbert Nan will review, our office will return call if any additional recommendations. Patient agreeable.   Dr. Talbert Nan -ok to proceed with Mirena IUD exchange as scheduled?   Cc: Lerry Liner

## 2018-11-22 NOTE — Telephone Encounter (Signed)
Call placed to patient to follow up in regards to scheduling abbreviated MRI. Left voicemail message requesting a return call

## 2018-11-22 NOTE — Telephone Encounter (Signed)
yes

## 2018-11-22 NOTE — Telephone Encounter (Signed)
Spoke with patient in regards to scheduling an abbreviated MRI. Patient acknowledged she has not scheduled, but she will contact [facility] regarding additional questions she has in regards to services.  Patient has also requested to speak with a nurse in regards to IUD. She is currently scheduled for an IUD exchange in May, but wants to discuss scheduling earlier and if time frame will be appropriate.  Forwarding to Triage

## 2018-11-22 NOTE — Telephone Encounter (Signed)
Message   My last Mirena IUD was placed in June 2015. I currently have an appointment with Dr. Talbert Nan to replace my Mirena IUD on 02/15/2019. I will be changing jobs and moving to another state with associated insurance changes (last day is May 8) around that time. I wanted to see if it was possible (and if my insurance would still cover it) to schedule my Mirena IUD replacement with Dr. Talbert Nan for late April (ideally) or early May. I can keep my current appointment for now, but I do need to explore other options regarding the timing. Thanks for your help.

## 2018-11-23 NOTE — Telephone Encounter (Signed)
Encounter closed

## 2018-11-25 ENCOUNTER — Other Ambulatory Visit: Payer: Self-pay | Admitting: Family Medicine

## 2018-11-25 DIAGNOSIS — M4722 Other spondylosis with radiculopathy, cervical region: Secondary | ICD-10-CM

## 2018-11-25 DIAGNOSIS — M2550 Pain in unspecified joint: Secondary | ICD-10-CM

## 2018-11-25 MED ORDER — GABAPENTIN 300 MG PO CAPS: ORAL_CAPSULE | ORAL | 0 refills | Status: DC

## 2018-11-25 NOTE — Telephone Encounter (Signed)
Pt requesting change in pharmacy Requested Prescriptions  Pending Prescriptions Disp Refills  . gabapentin (NEURONTIN) 300 MG capsule 270 capsule 0    Sig: TAKE 1 CAPSULE 3 TIMES     DAILY AS NEEDED     Neurology: Anticonvulsants - gabapentin Passed - 11/25/2018  8:22 AM      Passed - Valid encounter within last 12 months    Recent Outpatient Visits          1 week ago Chronic left-sided low back pain without sciatica   [facility] [place], [place] D, DO   2 weeks ago Bacterial vaginitis, recurrent   [place], [place], DO   2 months ago Osteoarthritis of spine with radiculopathy, cervical region   [place], [place] D, DO   3 months ago Elevated antinuclear antibody (ANA) level   [facility] D[place], [place] D, DO   3 months ago [place] Rigby, [name], DO      Future Appointments            In 2 weeks Talbert Nan, Francesca Jewett, MD [facility]

## 2018-11-25 NOTE — Telephone Encounter (Signed)
Copied from [place] [phone]. Topic: Quick Communication - Rx Refill/Question >> Nov 25, 2018  8:18 AM Keene Breath wrote: Medication: gabapentin (NEURONTIN) 300 MG capsule  Patient called to request a refill for the above medication.  Patient states she is all out and needs it as soon as possible.  Preferred Pharmacy (with phone number or street name): [id number] - [place], [place] - [place] [phone] (Phone) [phone] (Fax)

## 2018-11-28 ENCOUNTER — Ambulatory Visit
Admission: RE | Admit: 2018-11-28 | Discharge: 2018-11-28 | Disposition: A | Discharge status: Home / Self Care | Payer: No Typology Code available for payment source | Source: Ambulatory Visit | Attending: Obstetrics and Gynecology | Admitting: Obstetrics and Gynecology

## 2018-11-28 DIAGNOSIS — Z1231 Encounter for screening mammogram for malignant neoplasm of breast: Secondary | ICD-10-CM

## 2018-11-28 MED ORDER — GADOBUTROL 1 MMOL/ML IV SOLN
8.0000 mL | Freq: Once | INTRAVENOUS | Status: AC | PRN
  Administered 2018-11-28: 8 mL via INTRAVENOUS

## 2018-11-29 ENCOUNTER — Encounter: Payer: Self-pay | Admitting: Obstetrics and Gynecology

## 2018-11-29 ENCOUNTER — Telehealth: Payer: Self-pay | Admitting: Obstetrics and Gynecology

## 2018-11-29 NOTE — Telephone Encounter (Signed)
Dr. Talbert Nan - Patient is scheduled for IUD exchange on 01/23/19, can breast recheck be completed at that time?

## 2018-11-29 NOTE — Telephone Encounter (Signed)
Patient sent the following correspondence through [place]. Routing to triage to assist patient with request.   Good morning. I have a follow-up appointment scheduled with Dr. Talbert Nan for March 16 for a clinical breast examination. I completed an abbreviated breast MR yesterday and the results were negative. Based on this result I was wondering if the breast exam appointment was still needed or if cancelling that would be reasonable? Thank you.

## 2018-11-29 NOTE — Telephone Encounter (Signed)
Sure, just please add it to the schedule (the description)

## 2018-11-29 NOTE — Telephone Encounter (Signed)
Spoke with patient. OV cancelled for 3/16, patient will have breast recheck same day as IUD exchange, 01/23/19. Patient verbalizes understanding and is agreeable.   Appointment notes updated.   Encounter closed.

## 2018-12-12 ENCOUNTER — Ambulatory Visit: Payer: BC Managed Care – PPO | Admitting: Obstetrics and Gynecology

## 2018-12-13 DIAGNOSIS — Z9889 Other specified postprocedural states: Secondary | ICD-10-CM | POA: Insufficient documentation

## 2018-12-14 HISTORY — PX: OTHER SURGICAL HISTORY: SHX169

## 2018-12-15 ENCOUNTER — Ambulatory Visit: Payer: Self-pay | Admitting: Obstetrics and Gynecology

## 2018-12-19 ENCOUNTER — Encounter: Payer: Self-pay | Admitting: Family Medicine

## 2018-12-21 ENCOUNTER — Ambulatory Visit (INDEPENDENT_AMBULATORY_CARE_PROVIDER_SITE_OTHER): Payer: BC Managed Care – PPO | Admitting: Family Medicine

## 2018-12-21 ENCOUNTER — Encounter: Payer: Self-pay | Admitting: Family Medicine

## 2018-12-21 ENCOUNTER — Other Ambulatory Visit: Payer: Self-pay | Admitting: Family Medicine

## 2018-12-21 ENCOUNTER — Other Ambulatory Visit: Payer: Self-pay

## 2018-12-21 VITALS — Temp 98.7°F | Wt 177.8 lb

## 2018-12-21 DIAGNOSIS — F418 Other specified anxiety disorders: Secondary | ICD-10-CM | POA: Diagnosis not present

## 2018-12-21 DIAGNOSIS — F331 Major depressive disorder, recurrent, moderate: Secondary | ICD-10-CM | POA: Diagnosis not present

## 2018-12-21 DIAGNOSIS — R61 Generalized hyperhidrosis: Secondary | ICD-10-CM

## 2018-12-21 DIAGNOSIS — J301 Allergic rhinitis due to pollen: Secondary | ICD-10-CM

## 2018-12-21 DIAGNOSIS — I10 Essential (primary) hypertension: Secondary | ICD-10-CM

## 2018-12-21 DIAGNOSIS — F3341 Major depressive disorder, recurrent, in partial remission: Secondary | ICD-10-CM | POA: Diagnosis not present

## 2018-12-21 DIAGNOSIS — F902 Attention-deficit hyperactivity disorder, combined type: Secondary | ICD-10-CM | POA: Diagnosis not present

## 2018-12-21 DIAGNOSIS — M4722 Other spondylosis with radiculopathy, cervical region: Secondary | ICD-10-CM

## 2018-12-21 DIAGNOSIS — M2550 Pain in unspecified joint: Secondary | ICD-10-CM

## 2018-12-21 MED ORDER — MELOXICAM 15 MG PO TABS: 15.0000 mg | ORAL_TABLET | Freq: Every day | ORAL | 1 refills | Status: AC

## 2018-12-21 MED ORDER — TERAZOSIN HCL 10 MG PO CAPS: 10.0000 mg | ORAL_CAPSULE | Freq: Every day | ORAL | 1 refills | Status: AC

## 2018-12-21 MED ORDER — MONTELUKAST SODIUM 10 MG PO TABS: 10.0000 mg | ORAL_TABLET | Freq: Every day | ORAL | 1 refills | Status: AC

## 2018-12-21 MED ORDER — OLMESARTAN MEDOXOMIL 40 MG PO TABS: 40.0000 mg | ORAL_TABLET | Freq: Every day | ORAL | 1 refills | Status: AC

## 2018-12-21 MED ORDER — GABAPENTIN 300 MG PO CAPS: ORAL_CAPSULE | ORAL | 0 refills | Status: AC

## 2018-12-21 NOTE — Telephone Encounter (Signed)
Called pt, she would prefer to discuss need for extended leave with Dr. Juleen China. Scheduled for [facility] today at 2:00.

## 2018-12-21 NOTE — Progress Notes (Signed)
Virtual Visit via Video   I connected with Jill Frank on 12/21/18 at  2:00 PM EDT by a video enabled telemedicine application and verified that I am speaking with the correct person using two identifiers. Location patient: Home Location provider: [facility], Office Persons participating in the virtual visit: Rakiyah Rothrock, Briscoe Deutscher, DO, Jill Frank as CMA scribe.   I discussed the limitations of evaluation and management by telemedicine and the availability of in person appointments. The patient expressed understanding and agreed to proceed.  Subjective:  Jill Frank, CMA acting as scribe for Dr. Briscoe Deutscher.   HPI: Patient is requesting med refills and having forms filled out. She did have surgery on 12/13/2018. She was working on Special educational needs teacher with them. She is having issue with employer. She will be leaving [employer] in May and working remotely with another university. She will be working from home until she is able to move. She would like for [place] to hold our copy of FMLA until or if she needs [place] to fill out. She has been on antibiotics off and on for a while. If she needs refills we will call that in.    ROS: See pertinent positives and negatives per HPI.  Patient Active Problem List   Diagnosis Date Noted  . Elevated antinuclear antibody (ANA) level 08/27/2018  . Chronic left-sided low back pain without sciatica 08/27/2018  . B12 deficiency 07/08/2018  . IUD (intrauterine device) in place 12/19/2017  . Greater trochanteric pain syndrome of left lower extremity 10/29/2017  . Osteoarthritis of spine with radiculopathy, cervical region 10/29/2017  . ADHD, on Vyvanse, followed by Dr. Toy Care 08/02/2017  . OSA (obstructive sleep apnea) 07/21/2017  . Polyarthralgia 06/28/2017  . Left wrist pain 06/28/2017  . Seasonal allergic rhinitis due to pollen, on Singulair, Flonase, and Astelin 06/23/2017  . Hyperhidrosis 06/23/2017  . Hypertension, on Benicar and Hytrin   .  Depression     Social History   Tobacco Use  . Smoking status: Never Smoker  . Smokeless tobacco: Never Used  Substance Use Topics  . Alcohol use: Yes    Comment: Social --1 per week    Current Outpatient Medications:  .  acidophilus (RISAQUAD) CAPS capsule, Take by mouth daily., Disp: , Rfl:  .  ALPRAZolam (XANAX) 0.5 MG tablet, Take 0.5 mg by mouth as needed for anxiety (For air travel.)., Disp: , Rfl:  .  azelastine (ASTELIN) 0.1 % nasal spray, Place 1 spray into both nostrils daily. Use in each nostril as directed, Disp: 30 mL, Rfl: 3 .  buPROPion (WELLBUTRIN XL) 150 MG 24 hr tablet, TAKE 1 (150 MG TOTAL)TABLET3 TIMES A DAY (Patient taking differently: 150 mg 2 (two) times daily. ), Disp: 270 tablet, Rfl: 1 .  cholecalciferol (VITAMIN D3) 25 MCG (1000 UT) tablet, Take 1,000 Units by mouth daily., Disp: , Rfl:  .  clonazePAM (KLONOPIN) 0.5 MG tablet, Take 1 tablet (0.5 mg total) by mouth 2 (two) times daily as needed for anxiety., Disp: 180 tablet, Rfl: 0 .  Cyanocobalamin (VITAMIN B 12 PO), Take by mouth daily. , Disp: , Rfl:  .  DULoxetine (CYMBALTA) 30 MG capsule, TAKE 1 CAPSULE 3 TIMES A   DAY (Patient taking differently: 60 mg 2 (two) times daily. ), Disp: 270 capsule, Rfl: 1 .  Fluocin-Hydroquinone-Tretinoin (TRI-LUMA) 0.01-4-0.05 % CREA, Apply daily as needed., Disp: 90 g, Rfl: 1 .  fluticasone (FLONASE) 50 MCG/ACT nasal spray, Place 1 spray into both nostrils daily., Disp: 16  g, Rfl: 2 .  gabapentin (NEURONTIN) 300 MG capsule, TAKE 1 CAPSULE 3 TIMES     DAILY AS NEEDED, Disp: 270 capsule, Rfl: 0 .  levonorgestrel (MIRENA) 20 MCG/24HR IUD, 1 each by Intrauterine route once., Disp: , Rfl:  .  meloxicam (MOBIC) 15 MG tablet, Take 1 tablet (15 mg total) by mouth daily., Disp: 90 tablet, Rfl: 1 .  montelukast (SINGULAIR) 10 MG tablet, Take 1 tablet (10 mg total) by mouth at bedtime., Disp: 90 tablet, Rfl: 1 .  Multiple Vitamins-Minerals (VITAMIN D3 COMPLETE PO), Take by mouth., Disp:  , Rfl:  .  nitroGLYCERIN (NITRODUR - DOSED IN MG/24 HR) 0.2 mg/hr patch, Place 1/4 to 1/2 of a patch over affected region. Remove and replace once daily.  Slightly alter skin placement daily, Disp: 90 patch, Rfl: 0 .  olmesartan (BENICAR) 40 MG tablet, Take 1 tablet (40 mg total) by mouth daily., Disp: 90 tablet, Rfl: 1 .  terazosin (HYTRIN) 10 MG capsule, Take 1 capsule (10 mg total) by mouth at bedtime., Disp: 90 capsule, Rfl: 1 .  valACYclovir (VALTREX) 500 MG tablet, 1 tablet po daily or take one tablet po bid x 3 days for outbreak recurrence., Disp: 110 tablet, Rfl: 3 .  VYVANSE 70 MG capsule, Take 1 capsule by mouth daily., Disp: , Rfl: 0  Allergies  Allergen Reactions  . Doxycycline Hives  . Erythromycin Hives  . Penicillins Hives    Objective:   VITALS: Per patient if applicable, see vitals. GENERAL: Alert, appears well and in no acute distress. HEENT: Atraumatic, conjunctiva clear, no obvious abnormalities on inspection of external nose and ears. NECK: Normal movements of the head and neck. CARDIOPULMONARY: No increased WOB. Speaking in clear sentences. I:E ratio WNL.  MS: Moves all visible extremities without noticeable abnormality. PSYCH: Pleasant and cooperative, well-groomed. Speech normal rate and rhythm. Affect is appropriate. Insight and judgement are appropriate. Attention is focused, linear, and appropriate.  NEURO: CN grossly intact. Oriented as arrived to appointment on time with no prompting. Moves both UE equally.  SKIN: No obvious lesions, wounds, erythema, or cyanosis noted on face or hands.  Assessment and Plan:   Jill Frank was seen today for form completion and medication refill.  Diagnoses and all orders for this visit:  Situational anxiety  Moderate episode of recurrent major depressive disorder (HCC)  Recurrent major depressive disorder, in partial remission ([place])  Attention deficit hyperactivity disorder (ADHD), combined type  Osteoarthritis of spine  with radiculopathy, cervical region -     gabapentin (NEURONTIN) 300 MG capsule; TAKE 1 CAPSULE 3 TIMES     DAILY AS NEEDED -     meloxicam (MOBIC) 15 MG tablet; Take 1 tablet (15 mg total) by mouth daily.  Polyarthralgia -     gabapentin (NEURONTIN) 300 MG capsule; TAKE 1 CAPSULE 3 TIMES     DAILY AS NEEDED -     meloxicam (MOBIC) 15 MG tablet; Take 1 tablet (15 mg total) by mouth daily.  Seasonal allergic rhinitis due to pollen -     montelukast (SINGULAIR) 10 MG tablet; Take 1 tablet (10 mg total) by mouth at bedtime.  Essential hypertension -     olmesartan (BENICAR) 40 MG tablet; Take 1 tablet (40 mg total) by mouth daily.  Hyperhidrosis -     terazosin (HYTRIN) 10 MG capsule; Take 1 capsule (10 mg total) by mouth at bedtime.   . Reviewed expectations re: course of current medical issues. . Discussed self-management of symptoms. Marland Kitchen  Outlined signs and symptoms indicating need for more acute intervention. . Patient verbalized understanding and all questions were answered. Marland Kitchen Health Maintenance issues including appropriate healthy diet, exercise, and smoking avoidance were discussed with patient. . See orders for this visit as documented in the electronic medical record.  Briscoe Deutscher, DO 12/21/2018

## 2018-12-25 MED ORDER — METRONIDAZOLE 0.75 % EX GEL: CUTANEOUS | 9 refills | Status: DC

## 2018-12-28 ENCOUNTER — Telehealth: Payer: Self-pay | Admitting: Family Medicine

## 2018-12-28 DIAGNOSIS — M4802 Spinal stenosis, cervical region: Secondary | ICD-10-CM | POA: Insufficient documentation

## 2018-12-28 NOTE — Telephone Encounter (Signed)
See note

## 2018-12-28 NOTE — Telephone Encounter (Signed)
Voicemail message left by Eliezer Lofts, technician with [facility] regarding prescription of   metroNIDAZOLE (METROGEL) 0.75 % gel. Prescription sent to pharmacy on 12/25/18 has a dispense amount of 45g but the pharmacy states that the smallest amount dispensed is 70 g and wants to know if it is ok to change to 70 g dispense amount. Eliezer Lofts states that pharmacy has also sent faxes regarding this and this is the final attempt to get clarification on dosage and the medication will be placed on hold until the pharmacy hears back from the office. Contact number for [facility] pharmacy is [phone] ref [id number].

## 2018-12-29 NOTE — Telephone Encounter (Signed)
Called spoke with Ronalee Belts at [facility] gave ok to change dispensed amount to 70g.

## 2019-01-19 ENCOUNTER — Other Ambulatory Visit: Payer: Self-pay | Admitting: Family Medicine

## 2019-01-19 ENCOUNTER — Telehealth: Payer: Self-pay | Admitting: Obstetrics and Gynecology

## 2019-01-19 NOTE — Telephone Encounter (Signed)
Spoke with patient. Mirena IUD exchange and breast recheck rescheduled for 01/25/19 at 1030 with Dr. Talbert Nan. Orders previously placed. Patient verbalizes understanding and is agreeable.   Routing to [employer] for [employer].   Encounter closed.

## 2019-01-19 NOTE — Telephone Encounter (Signed)
Called patient she used last one recently. She likes to keep on hand due to frequent yeast infections.

## 2019-01-19 NOTE — Telephone Encounter (Signed)
Patient's appointment for Mirena and breast check 01/23/19 has been canceled and patient is aware triage will call her later to reschedule. Patient is hoping to have this done next week with Dr.Jertson.

## 2019-01-19 NOTE — Telephone Encounter (Signed)
Left message to call Sharee Pimple, RN at [facility].    Reschedule Mirena IUD exchange and breast recheck with Dr. Talbert Nan

## 2019-01-19 NOTE — Telephone Encounter (Signed)
Patient is returning call to [facility].

## 2019-01-23 ENCOUNTER — Ambulatory Visit: Payer: Self-pay | Admitting: Obstetrics and Gynecology

## 2019-01-24 ENCOUNTER — Other Ambulatory Visit: Payer: Self-pay

## 2019-01-25 ENCOUNTER — Ambulatory Visit (INDEPENDENT_AMBULATORY_CARE_PROVIDER_SITE_OTHER): Payer: BC Managed Care – PPO | Admitting: Obstetrics and Gynecology

## 2019-01-25 ENCOUNTER — Encounter: Payer: Self-pay | Admitting: Obstetrics and Gynecology

## 2019-01-25 VITALS — BP 114/70 | HR 100 | Resp 16 | Wt 185.0 lb

## 2019-01-25 DIAGNOSIS — Z30431 Encounter for routine checking of intrauterine contraceptive device: Secondary | ICD-10-CM | POA: Diagnosis not present

## 2019-01-25 DIAGNOSIS — Z01812 Encounter for preprocedural laboratory examination: Secondary | ICD-10-CM

## 2019-01-25 DIAGNOSIS — Z1239 Encounter for other screening for malignant neoplasm of breast: Secondary | ICD-10-CM

## 2019-01-25 DIAGNOSIS — Z85820 Personal history of malignant melanoma of skin: Secondary | ICD-10-CM

## 2019-01-25 LAB — POCT URINE PREGNANCY: Preg Test, Ur: NEGATIVE

## 2019-01-25 NOTE — Patient Instructions (Signed)

## 2019-01-25 NOTE — Progress Notes (Signed)
GYNECOLOGY  VISIT   HPI: 43 y.o.   Single White or Caucasian Not Hispanic or Latino  female   [id number] with No LMP recorded. (Menstrual status: IUD).   here for Mirena exchange and breast exam. Patient has had recurrent BV   She has had been treated by Dr Juleen China for multiple bouts of BV. She is on metrogel suppression.  She has a h/o melanoma at 49. She has noticed some genital nevi, not sure if they have changed. Dermatologist doesn't look in that area.   GYNECOLOGIC HISTORY: No LMP recorded. (Menstrual status: IUD). Contraception:Mirena  Menopausal hormone therapy: none        OB History    Gravida  0   Para  0   Term  0   Preterm  0   AB  0   Living  0     SAB  0   TAB  0   Ectopic  0   Multiple  0   Live Births  0              Patient Active Problem List   Diagnosis Date Noted  . Elevated antinuclear antibody (ANA) level 08/27/2018  . Chronic left-sided low back pain without sciatica 08/27/2018  . B12 deficiency 07/08/2018  . IUD (intrauterine device) in place 12/19/2017  . Greater trochanteric pain syndrome of left lower extremity 10/29/2017  . Osteoarthritis of spine with radiculopathy, cervical region 10/29/2017  . ADHD, on Vyvanse, followed by Dr. Toy Care 08/02/2017  . OSA (obstructive sleep apnea) 07/21/2017  . Polyarthralgia 06/28/2017  . Left wrist pain 06/28/2017  . Seasonal allergic rhinitis due to pollen, on Singulair, Flonase, and Astelin 06/23/2017  . Hyperhidrosis 06/23/2017  . Hypertension, on Benicar and Hytrin   . Depression     Past Medical History:  Diagnosis Date  . Abnormal Pap smear of cervix 05/2013   --had colposcopy for LGSIL but no treatment to cervix  . ADHD   . Anxiety   . Arthritis   . Cancer ([place]) 1998   back  . Depression   . Fibroadenoma    Rt.Breast  . Hyperhidrosis 06/23/2017  . Hyperhidrosis   . Hypertension   . Melasma   . Obstructive sleep apnea   . Piriformis syndrome   . PTSD (post-traumatic stress  disorder)   . Sacroiliitis ([place])   . Seasonal allergic rhinitis due to pollen 06/23/2017  . Vitamin D deficiency     Past Surgical History:  Procedure Laterality Date  . BREAST BIOPSY Right    benign  . BREAST SURGERY     breast biopsy  . COLPOSCOPY  05/2013   LGSIL--no treatment  . excision of melanoma  1998   --back  . HIP ARTHROSCOPY W/ LABRAL REPAIR Left 11/2013  . KNEE ARTHROSCOPY  02/2013    Current Outpatient Medications  Medication Sig Dispense Refill  . acidophilus (RISAQUAD) CAPS capsule Take by mouth daily.    Marland Kitchen ALPRAZolam (XANAX) 0.5 MG tablet Take 0.5 mg by mouth as needed for anxiety (For air travel.).    Marland Kitchen azelastine (ASTELIN) 0.1 % nasal spray Place 1 spray into both nostrils daily. Use in each nostril as directed 30 mL 3  . buPROPion (WELLBUTRIN XL) 150 MG 24 hr tablet Take 150 mg by mouth daily.    . cetirizine (ZYRTEC) 10 MG tablet Take 10 mg by mouth daily.    . cholecalciferol (VITAMIN D3) 25 MCG (1000 UT) tablet Take 1,000 Units by mouth daily.    Marland Kitchen  clonazePAM (KLONOPIN) 0.5 MG tablet Take 1 tablet (0.5 mg total) by mouth 2 (two) times daily as needed for anxiety. 180 tablet 0  . Cyanocobalamin (VITAMIN B 12 PO) Take by mouth daily.     . DULoxetine (CYMBALTA) 60 MG capsule Take 60 mg by mouth daily.    . Fluocin-Hydroquinone-Tretinoin (TRI-LUMA) 0.01-4-0.05 % CREA Apply daily as needed. 90 g 1  . fluticasone (FLONASE) 50 MCG/ACT nasal spray Place 1 spray into both nostrils daily. 16 g 2  . gabapentin (NEURONTIN) 300 MG capsule TAKE 1 CAPSULE 3 TIMES     DAILY AS NEEDED 270 capsule 0  . levonorgestrel (MIRENA) 20 MCG/24HR IUD 1 each by Intrauterine route once.    . meloxicam (MOBIC) 15 MG tablet Take 1 tablet (15 mg total) by mouth daily. 90 tablet 1  . metroNIDAZOLE (FLAGYL) 500 MG tablet     . metroNIDAZOLE (METROGEL) 2.29 % gel 1 APPLICATOR TWICE WEEKLY X 6 MONTHS FOR RECURRENT BV. 45 g 9  . Misc Natural Products (GLUCOSAMINE CHOND DOUBLE STR PO)     .  montelukast (SINGULAIR) 10 MG tablet Take 1 tablet (10 mg total) by mouth at bedtime. 90 tablet 1  . Multiple Vitamins-Minerals (VITAMIN D3 COMPLETE PO) Take by mouth.    . nitroGLYCERIN (NITRODUR - DOSED IN MG/24 HR) 0.2 mg/hr patch Place 1/4 to 1/2 of a patch over affected region. Remove and replace once daily.  Slightly alter skin placement daily 90 patch 0  . olmesartan (BENICAR) 40 MG tablet Take 1 tablet (40 mg total) by mouth daily. 90 tablet 1  . terazosin (HYTRIN) 10 MG capsule Take 1 capsule (10 mg total) by mouth at bedtime. 90 capsule 1  . TURMERIC PO     . valACYclovir (VALTREX) 500 MG tablet 1 tablet po daily or take one tablet po bid x 3 days for outbreak recurrence. 110 tablet 3  . vortioxetine HBr (TRINTELLIX) 20 MG TABS tablet     . VYVANSE 70 MG capsule Take 1 capsule by mouth daily.  0   No current facility-administered medications for this visit.      ALLERGIES: Doxycycline; Erythromycin; and Penicillins  Family History  Problem Relation Age of Onset  . Breast cancer Maternal Aunt 64       A & W  . Breast cancer Maternal Grandmother 45       mets to brain  . Autoimmune disease Mother   . Anxiety disorder Father   . CAD Father   . Other Father        dyslipidemia  . Bipolar disorder Sister   . ADD / ADHD Sister   . Thyroid disease Maternal Grandfather   . Cancer Paternal Grandfather 16       dec colon ca    Social History   Socioeconomic History  . Marital status: Single    Spouse name: Not on file  . Number of children: 0  . Years of education: Not on file  . Highest education level: Doctorate  Occupational History  . Occupation: Programme researcher, broadcasting/film/video    Employer: [employer]  Social Needs  . Financial resource strain: Not on file  . Food insecurity:    Worry: Not on file    Inability: Not on file  . Transportation needs:    Medical: Not on file    Non-medical: Not on file  Tobacco Use  . Smoking status: Never Smoker  . Smokeless tobacco:  Never Used  Substance and Sexual Activity  .  Alcohol use: Yes    Comment: Social --1 per week  . Drug use: No  . Sexual activity: Yes    Birth control/protection: I.U.D.    Comment: Mirena IUD inserted 02/2014  Lifestyle  . Physical activity:    Days per week: Not on file    Minutes per session: Not on file  . Stress: Not on file  Relationships  . Social connections:    Talks on phone: Not on file    Gets together: Not on file    Attends religious service: Not on file    Active member of club or organization: Not on file    Attends meetings of clubs or organizations: Not on file    Relationship status: Not on file  . Intimate partner violence:    Fear of current or ex partner: Not on file    Emotionally abused: Not on file    Physically abused: Not on file    Forced sexual activity: Not on file  Other Topics Concern  . Not on file  Social History Narrative   Lives alone in a 2 story home.  No children.  Right handed.  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  Education: doctorate.     Review of Systems  Constitutional: Negative.   HENT: Negative.   Eyes: Negative.   Respiratory: Negative.   Cardiovascular: Negative.   Gastrointestinal: Negative.   Genitourinary: Negative.   Musculoskeletal: Negative.   Skin: Negative.   Neurological: Negative.   Endo/Heme/Allergies: Negative.   Psychiatric/Behavioral: Negative.     PHYSICAL EXAMINATION:    BP 114/70 (BP Location: Left Arm, Patient Position: Sitting, Cuff Size: Large)   Pulse 100   Resp 16   Wt 185 lb (83.9 kg)   BMI 30.79 kg/m     General appearance: alert, cooperative and appears stated age Breasts: left breast larger than right, previously felt lumps are much less distinct today (right breast at 6 and left breast at 2).  Abdomen: soft, non-tender; non distended, no masses,  no organomegaly  Pelvic: External genitalia:  no lesions, few small benign appearing nevi.               Urethra:  normal appearing urethra with no  masses, tenderness or lesions              Bartholins and Skenes: normal                 Vagina: normal appearing vagina with normal color and discharge, no lesions              Cervix: no lesions and IUD string 3 cm.                The risks of the mirena IUD were reviewed with the patient, including infection, abnormal bleeding and uterine perfortion. Consent was signed.  A speculum was placed in the vagina, the IUD string was removed with ringed forceps. The cervix was cleansed with betadine. A tenaculum was placed on the cervix, the uterus sounded to 7 cm. The cervix was dilated to a 5 hagar dilator  The mirena IUD was inserted without difficulty. The string were cut to 3-4 cm. The tenaculum was removed. Slight oozing from the tenaculum site was stopped with pressure.   The patient tolerated the procedure well.    Chaperone was present for exam.  ASSESSMENT Breast check, improved exam H/O melanoma, genital nevi, benign appearing.  Contraception H/O recurrent BV, on metrogel suppression. Call if she is  having problems with the metrogel.     PLAN Mirena IUD removed and new IUD placed F/U in one month   An After Visit Summary was printed and given to the patient.  ~15 minutes face to face time of which over 50% was spent in counseling.

## 2019-02-02 ENCOUNTER — Encounter: Payer: Self-pay | Admitting: Physical Therapy

## 2019-02-06 NOTE — Progress Notes (Signed)
[facility] [address], [place] [address] Phone: [phone] Subjective:   Jill Frank, am serving as a scribe for Dr. Hulan Saas.    CC: Back pain follow-up  XNA:[id number]  Jill Frank is a 43 y.o. female coming in with complaint of back pain. Was a patient of Dr. Paulla Fore for manipulations. ACDF surgery March 17th, 2020. Complains of tightness throughout her entire body. Has been worked up for [employer]. Has been using Mobic, gabapentin, and cymbalta. Tylenol prn.  Patient has been diagnosed with a sacroiliac problem.  Tries to do the home exercises regularly.  Has not been as active recently secondary to the gym being close plus the recent surgery.  Has been trying to run on a more regular basis.     Past Medical History:  Diagnosis Date  . Abnormal Pap smear of cervix 05/2013   --had colposcopy for LGSIL but Frank treatment to cervix  . ADHD   . Anxiety   . Arthritis   . Cancer ([place]) 1998   back  . Depression   . Fibroadenoma    Rt.Breast  . Hyperhidrosis 06/23/2017  . Hyperhidrosis   . Hypertension   . Melasma   . Obstructive sleep apnea   . Piriformis syndrome   . PTSD (post-traumatic stress disorder)   . Sacroiliitis ([place])   . Seasonal allergic rhinitis due to pollen 06/23/2017  . Vitamin D deficiency    Past Surgical History:  Procedure Laterality Date  . BREAST BIOPSY Right    benign  . BREAST SURGERY     breast biopsy  . C3-4 ACDF  12/14/2018  . COLPOSCOPY  05/2013   LGSIL--Frank treatment  . excision of melanoma  1998   --back  . HIP ARTHROSCOPY W/ LABRAL REPAIR Left 11/2013  . KNEE ARTHROSCOPY  02/2013   Social History   Socioeconomic History  . Marital status: Single    Spouse name: Not on file  . Number of children: 0  . Years of education: Not on file  . Highest education level: Doctorate  Occupational History  . Occupation: Programme researcher, broadcasting/film/video    Employer: [employer]  Social Needs  . Financial  resource strain: Not on file  . Food insecurity:    Worry: Not on file    Inability: Not on file  . Transportation needs:    Medical: Not on file    Non-medical: Not on file  Tobacco Use  . Smoking status: Never Smoker  . Smokeless tobacco: Never Used  Substance and Sexual Activity  . Alcohol use: Yes    Comment: Social --1 per week  . Drug use: Frank  . Sexual activity: Yes    Birth control/protection: I.U.D.    Comment: Mirena IUD inserted 02/2014  Lifestyle  . Physical activity:    Days per week: Not on file    Minutes per session: Not on file  . Stress: Not on file  Relationships  . Social connections:    Talks on phone: Not on file    Gets together: Not on file    Attends religious service: Not on file    Active member of club or organization: Not on file    Attends meetings of clubs or organizations: Not on file    Relationship status: Not on file  Other Topics Concern  . Not on file  Social History Narrative   Lives alone in a 2 story home.  Frank children.  Right handed.  Nursing instructor at [employer]  Education: doctorate.    Allergies  Allergen Reactions  . Doxycycline Hives  . Erythromycin Hives  . Penicillins Hives   Family History  Problem Relation Age of Onset  . Breast cancer Maternal Aunt 27       A & W  . Breast cancer Maternal Grandmother 45       mets to brain  . Autoimmune disease Mother   . Anxiety disorder Father   . CAD Father   . Other Father        dyslipidemia  . Bipolar disorder Sister   . ADD / ADHD Sister   . Thyroid disease Maternal Grandfather   . Cancer Paternal Grandfather 24       dec colon ca    Current Outpatient Medications (Endocrine & Metabolic):  .  levonorgestrel (MIRENA) 20 MCG/24HR IUD, 1 each by Intrauterine route once.  Current Outpatient Medications (Cardiovascular):  .  nitroGLYCERIN (NITRODUR - DOSED IN MG/24 HR) 0.2 mg/hr patch, Place 1/4 to 1/2 of a patch over affected region. Remove and replace once daily.   Slightly alter skin placement daily .  olmesartan (BENICAR) 40 MG tablet, Take 1 tablet (40 mg total) by mouth daily. Marland Kitchen  terazosin (HYTRIN) 10 MG capsule, Take 1 capsule (10 mg total) by mouth at bedtime.  Current Outpatient Medications (Respiratory):  .  azelastine (ASTELIN) 0.1 % nasal spray, Place 1 spray into both nostrils daily. Use in each nostril as directed .  cetirizine (ZYRTEC) 10 MG tablet, Take 10 mg by mouth daily. .  fluticasone (FLONASE) 50 MCG/ACT nasal spray, Place 1 spray into both nostrils daily. .  montelukast (SINGULAIR) 10 MG tablet, Take 1 tablet (10 mg total) by mouth at bedtime.  Current Outpatient Medications (Analgesics):  .  meloxicam (MOBIC) 15 MG tablet, Take 1 tablet (15 mg total) by mouth daily.  Current Outpatient Medications (Hematological):  Marland Kitchen  Cyanocobalamin (VITAMIN B 12 PO), Take by mouth daily.   Current Outpatient Medications (Other):  .  acidophilus (RISAQUAD) CAPS capsule, Take by mouth daily. Marland Kitchen  ALPRAZolam (XANAX) 0.5 MG tablet, Take 0.5 mg by mouth as needed for anxiety (For air travel.). Marland Kitchen  buPROPion (WELLBUTRIN XL) 150 MG 24 hr tablet, Take 150 mg by mouth daily. .  cholecalciferol (VITAMIN D3) 25 MCG (1000 UT) tablet, Take 1,000 Units by mouth daily. .  clonazePAM (KLONOPIN) 0.5 MG tablet, Take 1 tablet (0.5 mg total) by mouth 2 (two) times daily as needed for anxiety. .  DULoxetine (CYMBALTA) 60 MG capsule, Take 60 mg by mouth daily. .  fluconazole (DIFLUCAN) 100 MG tablet, One tab three times a week .  Fluocin-Hydroquinone-Tretinoin (TRI-LUMA) 0.01-4-0.05 % CREA, Apply daily as needed. .  gabapentin (NEURONTIN) 300 MG capsule, TAKE 1 CAPSULE 3 TIMES     DAILY AS NEEDED .  metroNIDAZOLE (METROGEL) 7.34 % gel, 1 APPLICATOR TWICE WEEKLY X 6 MONTHS FOR RECURRENT BV. Marland Kitchen  Misc Natural Products (GLUCOSAMINE CHOND DOUBLE STR PO),  .  Multiple Vitamins-Minerals (VITAMIN D3 COMPLETE PO), Take by mouth. .  TURMERIC PO,  .  valACYclovir (VALTREX) 500  MG tablet, 1 tablet po daily or take one tablet po bid x 3 days for outbreak recurrence. .  vortioxetine HBr (TRINTELLIX) 20 MG TABS tablet,  .  VYVANSE 70 MG capsule, Take 1 capsule by mouth daily.    Past medical history, social, surgical and family history all reviewed in electronic medical record.  Frank pertanent information unless stated regarding  to the chief complaint.   Review of Systems:  Frank headache, visual changes, nausea, vomiting, diarrhea, constipation, dizziness, abdominal pain, skin rash, fevers, chills, night sweats, weight loss, swollen lymph nodes, body aches, joint swelling, muscle aches, chest pain, shortness of breath, mood changes.   Objective  Blood pressure 124/78, pulse 93, height 5\' 5"  (1.651 m), weight 188 lb (85.3 kg), SpO2 97 %.    General: Frank apparent distress alert and oriented x3 mood and affect normal, dressed appropriately.  Minorly anxious HEENT: Pupils equal, extraocular movements intact  Respiratory: Patient's speak in full sentences and does not appear short of breath  Cardiovascular: Frank lower extremity edema, non tender, Frank erythema  Skin: Warm dry intact with Frank signs of infection or rash on extremities or on axial skeleton.  Abdomen: Soft nontender  Neuro: Cranial nerves II through XII are intact, neurovascularly intact in all extremities with 2+ DTRs and 2+ pulses.  Lymph: Frank lymphadenopathy of posterior or anterior cervical chain or axillae bilaterally.  Gait normal with good balance and coordination.  MSK:  Non tender with full range of motion and good stability and symmetric strength and tone of shoulders, elbows, wrist, hip, knee and ankles bilaterally.  Low back exam shows severe tenderness to palpation of the left sacroiliac joint.  Patient does have some tightness on a Faber test.  Negative straight leg test.  Tenderness in the paraspinal musculature lumbar spine.  Deep tendon reflexes intact distally.  Osteopathic findings T8 extended  rotated and side bent right  L2 flexed rotated and side bent right Sacrum left on left     Impression and Recommendations:     This case required medical decision making of moderate complexity. The above documentation has been reviewed and is accurate and complete Lyndal Pulley, DO       Note: This dictation was prepared with Dragon dictation along with smaller phrase technology. Any transcriptional errors that result from this process are unintentional.

## 2019-02-08 ENCOUNTER — Encounter: Payer: Self-pay | Admitting: Family Medicine

## 2019-02-08 ENCOUNTER — Encounter

## 2019-02-08 ENCOUNTER — Ambulatory Visit (INDEPENDENT_AMBULATORY_CARE_PROVIDER_SITE_OTHER): Payer: BC Managed Care – PPO | Admitting: Family Medicine

## 2019-02-08 ENCOUNTER — Other Ambulatory Visit: Payer: Self-pay

## 2019-02-08 VITALS — Ht 65.0 in | Wt 185.0 lb

## 2019-02-08 DIAGNOSIS — F3341 Major depressive disorder, recurrent, in partial remission: Secondary | ICD-10-CM

## 2019-02-08 DIAGNOSIS — F418 Other specified anxiety disorders: Secondary | ICD-10-CM

## 2019-02-08 DIAGNOSIS — R5383 Other fatigue: Secondary | ICD-10-CM | POA: Diagnosis not present

## 2019-02-08 DIAGNOSIS — M545 Low back pain, unspecified: Secondary | ICD-10-CM

## 2019-02-08 DIAGNOSIS — N760 Acute vaginitis: Secondary | ICD-10-CM | POA: Diagnosis not present

## 2019-02-08 DIAGNOSIS — G8929 Other chronic pain: Secondary | ICD-10-CM

## 2019-02-08 DIAGNOSIS — B9689 Other specified bacterial agents as the cause of diseases classified elsewhere: Secondary | ICD-10-CM

## 2019-02-08 DIAGNOSIS — I10 Essential (primary) hypertension: Secondary | ICD-10-CM

## 2019-02-08 DIAGNOSIS — R61 Generalized hyperhidrosis: Secondary | ICD-10-CM

## 2019-02-08 DIAGNOSIS — E538 Deficiency of other specified B group vitamins: Secondary | ICD-10-CM

## 2019-02-08 DIAGNOSIS — E559 Vitamin D deficiency, unspecified: Secondary | ICD-10-CM

## 2019-02-08 DIAGNOSIS — E7800 Pure hypercholesterolemia, unspecified: Secondary | ICD-10-CM

## 2019-02-08 DIAGNOSIS — K625 Hemorrhage of anus and rectum: Secondary | ICD-10-CM

## 2019-02-08 MED ORDER — FLUCONAZOLE 100 MG PO TABS: ORAL_TABLET | ORAL | 0 refills | Status: AC

## 2019-02-08 MED ORDER — METRONIDAZOLE 0.75 % EX GEL: CUTANEOUS | 0 refills | Status: AC

## 2019-02-08 NOTE — Progress Notes (Signed)
Virtual Visit via Video   Due to the COVID-19 pandemic, this visit was completed with telemedicine (audio/video) technology to reduce patient and provider exposure as well as to preserve personal protective equipment.   I connected with Jill Frank by a video enabled telemedicine application and verified that I am speaking with the correct person using two identifiers. Location patient: Home Location provider: [facility], Office Persons participating in the virtual visit: Yoltzin Masoud, Briscoe Deutscher, DO Lonell Grandchild, CMA acting as scribe for Dr. Briscoe Deutscher.   I discussed the limitations of evaluation and management by telemedicine and the availability of in person appointments. The patient expressed understanding and agreed to proceed.  Care Team   Patient Care Team: Briscoe Deutscher, DO as PCP - General (Family Medicine) Alphonsa Overall, MD as Consulting Physician (General Surgery) Lyndee Hensen, PT as Physical Therapist (Physical Therapy) Consuella Lose, MD as Consulting Physician (Neurosurgery)  Subjective:   HPI: Patient has met her deductible for current insurance. It will run out in may. Would like to have any test or exams done now before she changes insurance.    Situational anxiety Taking Clonazepam 0.5 mg BID prn. She is followed by Dr. Toy Care and is working on making adjustments on medications. 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 Still anxious when starting to work, feels that it is PTSD.  Polyarthralgia Taking gabapentin 300 MG TID prn and Meloxicam 15 mg daily prn. Has seen sports med in the past.   Seasonal allergic rhinitis due to pollen Taking montelukast (SINGULAIR) 10 MG daily.   Recurrent BV Metronidazole cream twice weekly. Getting yeast infections after.   Hyperhidrosis Taking terazosin (HYTRIN) 10 MG at bedtime. Would like to see if Botox injections possible.   Review of Systems  Constitutional:  Negative for chills and fever.  HENT: Negative for hearing loss and tinnitus.   Eyes: Negative for blurred vision and double vision.  Respiratory: Negative for cough and hemoptysis.   Cardiovascular: Negative for chest pain and palpitations.  Gastrointestinal: Negative for heartburn and nausea.  Genitourinary: Negative for dysuria and urgency.  Musculoskeletal: Negative for myalgias and neck pain.  Skin: Negative for rash.  Neurological: Negative for dizziness and headaches.  Endo/Heme/Allergies: Negative for environmental allergies. Does not bruise/bleed easily.  Psychiatric/Behavioral: Negative for depression and suicidal ideas.    Patient Active Problem List   Diagnosis Date Noted  . Cervical spinal stenosis 12/28/2018  . History of cervical discectomy 12/13/2018  . Elevated antinuclear antibody (ANA) level 08/27/2018  . Chronic left-sided low back pain without sciatica 08/27/2018  . B12 deficiency 07/08/2018  . IUD (intrauterine device) in place 12/19/2017  . Greater trochanteric pain syndrome of left lower extremity 10/29/2017  . Osteoarthritis of spine with radiculopathy, cervical region 10/29/2017  . ADHD, on Vyvanse, followed by Dr. Toy Care 08/02/2017  . OSA (obstructive sleep apnea) 07/21/2017  . Polyarthralgia 06/28/2017  . Left wrist pain 06/28/2017  . Seasonal allergic rhinitis due to pollen, on Singulair, Flonase, and Astelin 06/23/2017  . Hyperhidrosis 06/23/2017  . Hypertension, on Benicar and Hytrin   . Depression     Social History   Tobacco Use  . Smoking status: Never Smoker  . Smokeless tobacco: Never Used  Substance Use Topics  . Alcohol use: Yes    Comment: Social --1 per week    Current Outpatient Medications:  .  acidophilus (RISAQUAD) CAPS capsule, Take by mouth daily., Disp: , Rfl:  .  ALPRAZolam (XANAX) 0.5 MG tablet,  Take 0.5 mg by mouth as needed for anxiety (For air travel.)., Disp: , Rfl:  .  azelastine (ASTELIN) 0.1 % nasal spray, Place 1  spray into both nostrils daily. Use in each nostril as directed, Disp: 30 mL, Rfl: 3 .  buPROPion (WELLBUTRIN XL) 150 MG 24 hr tablet, Take 150 mg by mouth daily., Disp: , Rfl:  .  cetirizine (ZYRTEC) 10 MG tablet, Take 10 mg by mouth daily., Disp: , Rfl:  .  cholecalciferol (VITAMIN D3) 25 MCG (1000 UT) tablet, Take 1,000 Units by mouth daily., Disp: , Rfl:  .  clonazePAM (KLONOPIN) 0.5 MG tablet, Take 1 tablet (0.5 mg total) by mouth 2 (two) times daily as needed for anxiety., Disp: 180 tablet, Rfl: 0 .  Cyanocobalamin (VITAMIN B 12 PO), Take by mouth daily. , Disp: , Rfl:  .  DULoxetine (CYMBALTA) 60 MG capsule, Take 60 mg by mouth daily., Disp: , Rfl:  .  Fluocin-Hydroquinone-Tretinoin (TRI-LUMA) 0.01-4-0.05 % CREA, Apply daily as needed., Disp: 90 g, Rfl: 1 .  fluticasone (FLONASE) 50 MCG/ACT nasal spray, Place 1 spray into both nostrils daily., Disp: 16 g, Rfl: 2 .  gabapentin (NEURONTIN) 300 MG capsule, TAKE 1 CAPSULE 3 TIMES     DAILY AS NEEDED, Disp: 270 capsule, Rfl: 0 .  levonorgestrel (MIRENA) 20 MCG/24HR IUD, 1 each by Intrauterine route once., Disp: , Rfl:  .  meloxicam (MOBIC) 15 MG tablet, Take 1 tablet (15 mg total) by mouth daily., Disp: 90 tablet, Rfl: 1 .  metroNIDAZOLE (METROGEL) 3.53 % gel, 1 APPLICATOR TWICE WEEKLY X 6 MONTHS FOR RECURRENT BV., Disp: 135 g, Rfl: 0 .  Misc Natural Products (GLUCOSAMINE CHOND DOUBLE STR PO), , Disp: , Rfl:  .  montelukast (SINGULAIR) 10 MG tablet, Take 1 tablet (10 mg total) by mouth at bedtime., Disp: 90 tablet, Rfl: 1 .  Multiple Vitamins-Minerals (VITAMIN D3 COMPLETE PO), Take by mouth., Disp: , Rfl:  .  nitroGLYCERIN (NITRODUR - DOSED IN MG/24 HR) 0.2 mg/hr patch, Place 1/4 to 1/2 of a patch over affected region. Remove and replace once daily.  Slightly alter skin placement daily, Disp: 90 patch, Rfl: 0 .  olmesartan (BENICAR) 40 MG tablet, Take 1 tablet (40 mg total) by mouth daily., Disp: 90 tablet, Rfl: 1 .  terazosin (HYTRIN) 10 MG  capsule, Take 1 capsule (10 mg total) by mouth at bedtime., Disp: 90 capsule, Rfl: 1 .  TURMERIC PO, , Disp: , Rfl:  .  valACYclovir (VALTREX) 500 MG tablet, 1 tablet po daily or take one tablet po bid x 3 days for outbreak recurrence., Disp: 110 tablet, Rfl: 3 .  vortioxetine HBr (TRINTELLIX) 20 MG TABS tablet, , Disp: , Rfl:  .  VYVANSE 70 MG capsule, Take 1 capsule by mouth daily., Disp: , Rfl: 0 .  fluconazole (DIFLUCAN) 100 MG tablet, One tab three times a week, Disp: 36 tablet, Rfl: 0  Allergies  Allergen Reactions  . Doxycycline Hives  . Erythromycin Hives  . Penicillins Hives    Objective:   VITALS: Per patient if applicable, see vitals. GENERAL: Alert, appears well and in no acute distress. HEENT: Atraumatic, conjunctiva clear, no obvious abnormalities on inspection of external nose and ears. NECK: Normal movements of the head and neck. CARDIOPULMONARY: No increased WOB. Speaking in clear sentences. I:E ratio WNL.  MS: Moves all visible extremities without noticeable abnormality. PSYCH: Pleasant and cooperative, well-groomed. Speech normal rate and rhythm. Affect is appropriate. Insight and judgement are appropriate. Attention  is focused, linear, and appropriate.  NEURO: CN grossly intact. Oriented as arrived to appointment on time with no prompting. Moves both UE equally.  SKIN: No obvious lesions, wounds, erythema, or cyanosis noted on face or hands.  Depression screen [facility] 2/9 02/25/2018 06/21/2017  Decreased Interest 3 2  Down, Depressed, Hopeless 1 1  PHQ - 2 Score 4 3  Altered sleeping 1 2  Tired, decreased energy 3 3  Change in appetite 1 2  Feeling bad or failure about yourself  1 1  Trouble concentrating 1 1  Moving slowly or fidgety/restless 0 1  Suicidal thoughts 0 0  PHQ-9 Score 11 13  Difficult doing work/chores Somewhat difficult -   Assessment and Plan:   Braedyn was seen today for follow-up.  Diagnoses and all orders for this visit:  Bacterial  vaginitis, recurrent -     metroNIDAZOLE (METROGEL) 5.03 % gel; 1 APPLICATOR TWICE WEEKLY X 6 MONTHS FOR RECURRENT BV. -     fluconazole (DIFLUCAN) 100 MG tablet; One tab three times a week  Other fatigue -     CBC with Differential/Platelet; Future -     Hemoglobin A1c; Future -     TSH; Future  B12 deficiency -     Vitamin B12; Future  Chronic left-sided low back pain without sciatica  Essential hypertension  Situational anxiety  Recurrent major depressive disorder, in partial remission (HCC)  Vitamin D deficiency -     VITAMIN D 25 Hydroxy (Vit-D Deficiency, Fractures); Future  Pure hypercholesterolemia -     Lipid panel; Future -     Comprehensive metabolic panel; Future  Hyperhidrosis -     Ambulatory referral to Plastic Surgery  Painless rectal bleeding -     Ambulatory referral to Gastroenterology; Future   . COVID-19 Education: The signs and symptoms of COVID-19 were discussed with the patient and how to seek care for testing if needed. The importance of social distancing was discussed today. . Reviewed expectations re: course of current medical issues. . Discussed self-management of symptoms. . Outlined signs and symptoms indicating need for more acute intervention. . Patient verbalized understanding and all questions were answered. Marland Kitchen Health Maintenance issues including appropriate healthy diet, exercise, and smoking avoidance were discussed with patient. . See orders for this visit as documented in the electronic medical record.  Briscoe Deutscher, DO  Records requested if needed. Time spent: 25 minutes, of which >50% was spent in obtaining information about her symptoms, reviewing her previous labs, evaluations, and treatments, counseling her about her condition (please see the discussed topics above), and developing a plan to further investigate it; she had a number of questions which I addressed.

## 2019-02-09 ENCOUNTER — Encounter: Payer: Self-pay | Admitting: Family Medicine

## 2019-02-09 ENCOUNTER — Ambulatory Visit (INDEPENDENT_AMBULATORY_CARE_PROVIDER_SITE_OTHER)
Admission: RE | Admit: 2019-02-09 | Discharge: 2019-02-09 | Disposition: A | Discharge status: Home / Self Care | Payer: BC Managed Care – PPO | Source: Ambulatory Visit | Attending: Family Medicine | Admitting: Family Medicine

## 2019-02-09 ENCOUNTER — Telehealth: Payer: Self-pay | Admitting: Family Medicine

## 2019-02-09 ENCOUNTER — Ambulatory Visit (INDEPENDENT_AMBULATORY_CARE_PROVIDER_SITE_OTHER): Payer: BC Managed Care – PPO | Admitting: Family Medicine

## 2019-02-09 ENCOUNTER — Other Ambulatory Visit: Payer: Self-pay

## 2019-02-09 ENCOUNTER — Other Ambulatory Visit (INDEPENDENT_AMBULATORY_CARE_PROVIDER_SITE_OTHER): Payer: BC Managed Care – PPO

## 2019-02-09 VITALS — BP 124/78 | HR 93 | Ht 65.0 in | Wt 188.0 lb

## 2019-02-09 DIAGNOSIS — G8929 Other chronic pain: Secondary | ICD-10-CM | POA: Diagnosis not present

## 2019-02-09 DIAGNOSIS — E7800 Pure hypercholesterolemia, unspecified: Secondary | ICD-10-CM

## 2019-02-09 DIAGNOSIS — M545 Low back pain, unspecified: Secondary | ICD-10-CM

## 2019-02-09 DIAGNOSIS — E538 Deficiency of other specified B group vitamins: Secondary | ICD-10-CM | POA: Diagnosis not present

## 2019-02-09 DIAGNOSIS — R5383 Other fatigue: Secondary | ICD-10-CM | POA: Diagnosis not present

## 2019-02-09 DIAGNOSIS — M2550 Pain in unspecified joint: Secondary | ICD-10-CM | POA: Diagnosis not present

## 2019-02-09 DIAGNOSIS — E559 Vitamin D deficiency, unspecified: Secondary | ICD-10-CM

## 2019-02-09 DIAGNOSIS — M999 Biomechanical lesion, unspecified: Secondary | ICD-10-CM | POA: Insufficient documentation

## 2019-02-09 LAB — CBC WITH DIFFERENTIAL/PLATELET
Basophils Absolute: 0.1 10*3/uL (ref 0.0–0.1)
Basophils Relative: 0.7 % (ref 0.0–3.0)
Eosinophils Absolute: 0.1 10*3/uL (ref 0.0–0.7)
Eosinophils Relative: 0.6 % (ref 0.0–5.0)
HCT: 39.9 % (ref 36.0–46.0)
Hemoglobin: 13.6 g/dL (ref 12.0–15.0)
Lymphocytes Relative: 23.7 % (ref 12.0–46.0)
Lymphs Abs: 2 10*3/uL (ref 0.7–4.0)
MCHC: 34.1 g/dL (ref 30.0–36.0)
MCV: 95 fl (ref 78.0–100.0)
Monocytes Absolute: 0.6 10*3/uL (ref 0.1–1.0)
Monocytes Relative: 7.5 % (ref 3.0–12.0)
Neutro Abs: 5.6 10*3/uL (ref 1.4–7.7)
Neutrophils Relative %: 67.5 % (ref 43.0–77.0)
Platelets: 290 10*3/uL (ref 150.0–400.0)
RBC: 4.2 Mil/uL (ref 3.87–5.11)
RDW: 13.4 % (ref 11.5–15.5)
WBC: 8.3 10*3/uL (ref 4.0–10.5)

## 2019-02-09 LAB — LIPID PANEL
Cholesterol: 200 mg/dL (ref 0–200)
HDL: 82.8 mg/dL (ref >39.00)
LDL Cholesterol: 95 mg/dL (ref 0–99)
NonHDL: 117.08
Total CHOL/HDL Ratio: 2
Triglycerides: 108 mg/dL (ref 0.0–149.0)
VLDL: 21.6 mg/dL (ref 0.0–40.0)

## 2019-02-09 LAB — COMPREHENSIVE METABOLIC PANEL
ALT: 11 U/L (ref 0–35)
AST: 12 U/L (ref 0–37)
Albumin: 4.4 g/dL (ref 3.5–5.2)
Alkaline Phosphatase: 47 U/L (ref 39–117)
BUN: 14 mg/dL (ref 6–23)
CO2: 24 mEq/L (ref 19–32)
Calcium: 9.1 mg/dL (ref 8.4–10.5)
Chloride: 104 mEq/L (ref 96–112)
Creatinine, Ser: 0.79 mg/dL (ref 0.40–1.20)
GFR: 79.41 mL/min (ref >60.00)
Glucose, Bld: 93 mg/dL (ref 70–99)
Potassium: 3.7 mEq/L (ref 3.5–5.1)
Sodium: 137 mEq/L (ref 135–145)
Total Bilirubin: 0.5 mg/dL (ref 0.2–1.2)
Total Protein: 6.8 g/dL (ref 6.0–8.3)

## 2019-02-09 LAB — URIC ACID: Uric Acid, Serum: 2.8 mg/dL (ref 2.4–7.0)

## 2019-02-09 LAB — HEMOGLOBIN A1C: Hgb A1c MFr Bld: 5.3 % (ref 4.6–6.5)

## 2019-02-09 NOTE — Telephone Encounter (Signed)
I called GI - they are only doing colonoscopies that are emergent.  They are not scheduling anything routine right now.  They are looking at starting it again, but no date has been finalized.

## 2019-02-09 NOTE — Assessment & Plan Note (Signed)
Decision today to treat with OMT was based on Physical Exam  After verbal consent patient was treated with HVLA, ME, FPR techniques in  thoracic, lumbar and sacral areas  Patient tolerated the procedure well with improvement in symptoms  Patient given exercises, stretches and lifestyle modifications  See medications in patient instructions if given  Patient will follow up in 12 weeks

## 2019-02-09 NOTE — Patient Instructions (Signed)
Good to see you  \Will take some work  Focus on stretching hip flexors after running Ice 20 minutes 2 times daily. Usually after activity and before bed. Xray downstairs and labs  See me again in 4ish weeks

## 2019-02-09 NOTE — Assessment & Plan Note (Signed)
Appears to be more sacroiliac dysfunction and sacroiliac potential arthritis.  X-rays ordered today for further evaluation.  Patient has been worked up for autoimmune with no significant findings.  Will get uric acid level.  Has responded well to manipulation previously.  Discussed which activities of doing which wants to avoid.  No change in medicines otherwise.  Follow-up with me again in 4 to 6 weeks

## 2019-02-09 NOTE — Telephone Encounter (Signed)
I received this message from GI -  "I spoke w/pt. She saw a GI provider last year and does not remember where. She is wanting to have a colon done before the end of the month due to insurance purposes. Explained due to the covid-19 we are not doing screening colonoscopies at this time and she would need a virtual visit. She is wanting to be referred to the GI provider she is established with."  I called patient to verify what she needed - she explained that she needs a colonoscopy by 5/31 for insurance purposes.  She is not established anywhere else.  She is upset that [facility] isn't scheduling colonoscopies right now and would like Dr Juleen China to personally call them and tell them that it has to be done.  I explained that, that isn't how this works - they are not doing them due to [place] and we cannot control that.  She proceeded to explain, very rudely, the difference between diagnostic, routine and elective procedures and was extremely rude the entire conversation.  Pt would like a call back from Dr Juleen China or [place].

## 2019-02-10 ENCOUNTER — Encounter: Payer: Self-pay | Admitting: Family Medicine

## 2019-02-10 LAB — VITAMIN D 25 HYDROXY (VIT D DEFICIENCY, FRACTURES): VITD: 40.92 ng/mL (ref 30.00–100.00)

## 2019-02-10 LAB — FERRITIN: Ferritin: 40.8 ng/mL (ref 10.0–291.0)

## 2019-02-10 LAB — VITAMIN B12: Vitamin B-12: 355 pg/mL (ref 211–911)

## 2019-02-10 LAB — TSH: TSH: 0.81 u[IU]/mL (ref 0.35–4.50)

## 2019-02-10 NOTE — Telephone Encounter (Signed)
Patient called she is aware that there is no way we can tell if they will be able to do before end of the month. That she would be better keeping app on 18th and talk the GI office about that.

## 2019-02-10 NOTE — Telephone Encounter (Signed)
It looks like pt has been scheduled with GI for 02/13/19.

## 2019-02-13 ENCOUNTER — Ambulatory Visit (INDEPENDENT_AMBULATORY_CARE_PROVIDER_SITE_OTHER): Payer: BC Managed Care – PPO | Admitting: Gastroenterology

## 2019-02-13 ENCOUNTER — Other Ambulatory Visit: Payer: Self-pay

## 2019-02-13 ENCOUNTER — Encounter: Payer: Self-pay | Admitting: Gastroenterology

## 2019-02-13 ENCOUNTER — Encounter: Payer: Self-pay | Admitting: Family Medicine

## 2019-02-13 VITALS — BP 124/78 | Ht 65.0 in

## 2019-02-13 DIAGNOSIS — K625 Hemorrhage of anus and rectum: Secondary | ICD-10-CM

## 2019-02-13 NOTE — Progress Notes (Signed)
GYNECOLOGY  VISIT   HPI: 43 y.o.   Single White or Caucasian Not Hispanic or Latino  female   G0P0000 with No LMP recorded. (Menstrual status: IUD).   here for IUD check, Mirena IUD removed and new one inserted last month. Doing well, no c/o.  She is having a colonoscopy today for rectal bleeding. She is moving out of State in the next few months. Moving to [place].   GYNECOLOGIC HISTORY: No LMP recorded. (Menstrual status: IUD). Contraception:IUD Menopausal hormone therapy: None        OB History    Gravida  0   Para  0   Term  0   Preterm  0   AB  0   Living  0     SAB  0   TAB  0   Ectopic  0   Multiple  0   Live Births  0              Patient Active Problem List   Diagnosis Date Noted  . Nonallopathic lesion of sacral region 02/09/2019  . Nonallopathic lesion of lumbosacral region 02/09/2019  . Cervical spinal stenosis 12/28/2018  . History of cervical discectomy 12/13/2018  . Elevated antinuclear antibody (ANA) level 08/27/2018  . Chronic left-sided low back pain without sciatica 08/27/2018  . B12 deficiency 07/08/2018  . IUD (intrauterine device) in place 12/19/2017  . Greater trochanteric pain syndrome of left lower extremity 10/29/2017  . Osteoarthritis of spine with radiculopathy, cervical region 10/29/2017  . ADHD, on Vyvanse, followed by Dr. Toy Care 08/02/2017  . OSA (obstructive sleep apnea) 07/21/2017  . Polyarthralgia 06/28/2017  . Left wrist pain 06/28/2017  . Seasonal allergic rhinitis due to pollen, on Singulair, Flonase, and Astelin 06/23/2017  . Hyperhidrosis 06/23/2017  . Hypertension, on Benicar and Hytrin   . Depression     Past Medical History:  Diagnosis Date  . Abnormal Pap smear of cervix 05/2013   --had colposcopy for LGSIL but no treatment to cervix  . ADHD   . Anxiety   . Arthritis   . Cancer ([place]) 1998   back  . Depression   . Fibroadenoma    Rt.Breast  . Hyperhidrosis 06/23/2017  . Hyperhidrosis   . Hypertension    . Melasma   . Obstructive sleep apnea   . Piriformis syndrome   . PTSD (post-traumatic stress disorder)   . Sacroiliitis ([place])   . Seasonal allergic rhinitis due to pollen 06/23/2017  . Vitamin D deficiency     Past Surgical History:  Procedure Laterality Date  . BREAST BIOPSY Right    benign  . BREAST SURGERY     breast biopsy  . C3-4 ACDF  12/14/2018  . COLPOSCOPY  05/2013   LGSIL--no treatment  . excision of melanoma  1998   --back  . HIP ARTHROSCOPY W/ LABRAL REPAIR Left 11/2013  . KNEE ARTHROSCOPY  02/2013    Current Outpatient Medications  Medication Sig Dispense Refill  . acidophilus (RISAQUAD) CAPS capsule Take by mouth daily.    Marland Kitchen ALPRAZolam (XANAX) 0.5 MG tablet Take 0.5 mg by mouth as needed for anxiety (For air travel.).    Marland Kitchen azelastine (ASTELIN) 0.1 % nasal spray Place 1 spray into both nostrils daily. Use in each nostril as directed 30 mL 3  . buPROPion (WELLBUTRIN XL) 150 MG 24 hr tablet Take 150 mg by mouth daily.    . cetirizine (ZYRTEC) 10 MG tablet Take 10 mg by mouth daily.    Marland Kitchen  cholecalciferol (VITAMIN D3) 25 MCG (1000 UT) tablet Take 1,000 Units by mouth daily.    . clonazePAM (KLONOPIN) 0.5 MG tablet Take 1 tablet (0.5 mg total) by mouth 2 (two) times daily as needed for anxiety. 180 tablet 0  . Cyanocobalamin (VITAMIN B 12 PO) Take by mouth daily.     . DULoxetine (CYMBALTA) 60 MG capsule Take 30 mg by mouth daily.     . fluconazole (DIFLUCAN) 100 MG tablet One tab three times a week 36 tablet 0  . Fluocin-Hydroquinone-Tretinoin (TRI-LUMA) 0.01-4-0.05 % CREA Apply daily as needed. 90 g 1  . fluticasone (FLONASE) 50 MCG/ACT nasal spray Place 1 spray into both nostrils daily. 16 g 2  . gabapentin (NEURONTIN) 300 MG capsule TAKE 1 CAPSULE 3 TIMES     DAILY AS NEEDED 270 capsule 0  . levonorgestrel (MIRENA) 20 MCG/24HR IUD 1 each by Intrauterine route once.    . meloxicam (MOBIC) 15 MG tablet Take 1 tablet (15 mg total) by mouth daily. 90 tablet 1  .  metroNIDAZOLE (METROGEL) 6.50 % gel 1 APPLICATOR TWICE WEEKLY X 6 MONTHS FOR RECURRENT BV. 135 g 0  . Misc Natural Products (GLUCOSAMINE CHOND DOUBLE STR PO)     . montelukast (SINGULAIR) 10 MG tablet Take 1 tablet (10 mg total) by mouth at bedtime. 90 tablet 1  . Multiple Vitamins-Minerals (VITAMIN D3 COMPLETE PO) Take by mouth.    . nitroGLYCERIN (NITRODUR - DOSED IN MG/24 HR) 0.2 mg/hr patch Place 1/4 to 1/2 of a patch over affected region. Remove and replace once daily.  Slightly alter skin placement daily 90 patch 0  . olmesartan (BENICAR) 40 MG tablet Take 1 tablet (40 mg total) by mouth daily. 90 tablet 1  . terazosin (HYTRIN) 10 MG capsule Take 1 capsule (10 mg total) by mouth at bedtime. 90 capsule 1  . TURMERIC PO     . valACYclovir (VALTREX) 500 MG tablet 1 tablet po daily or take one tablet po bid x 3 days for outbreak recurrence. 110 tablet 3  . vortioxetine HBr (TRINTELLIX) 20 MG TABS tablet     . VYVANSE 70 MG capsule Take 1 capsule by mouth daily.  0   No current facility-administered medications for this visit.      ALLERGIES: Doxycycline; Erythromycin; and Penicillins  Family History  Problem Relation Age of Onset  . Breast cancer Maternal Aunt 28       A & W  . Breast cancer Maternal Grandmother 45       mets to brain  . Autoimmune disease Mother   . Anxiety disorder Father   . CAD Father   . Other Father        dyslipidemia  . Bipolar disorder Sister   . ADD / ADHD Sister   . Thyroid disease Maternal Grandfather   . Cancer Paternal Grandfather 89       dec colon ca    Social History   Socioeconomic History  . Marital status: Single    Spouse name: Not on file  . Number of children: 0  . Years of education: Not on file  . Highest education level: Doctorate  Occupational History  . Occupation: Programme researcher, broadcasting/film/video    Employer: [employer]  Social Needs  . Financial resource strain: Not on file  . Food insecurity:    Worry: Not on file    Inability:  Not on file  . Transportation needs:    Medical: Not on file  Non-medical: Not on file  Tobacco Use  . Smoking status: Never Smoker  . Smokeless tobacco: Never Used  Substance and Sexual Activity  . Alcohol use: Yes    Comment: Social --1 per week  . Drug use: No  . Sexual activity: Yes    Birth control/protection: I.U.D.    Comment: Mirena IUD inserted 02/2014  Lifestyle  . Physical activity:    Days per week: Not on file    Minutes per session: Not on file  . Stress: Not on file  Relationships  . Social connections:    Talks on phone: Not on file    Gets together: Not on file    Attends religious service: Not on file    Active member of club or organization: Not on file    Attends meetings of clubs or organizations: Not on file    Relationship status: Not on file  . Intimate partner violence:    Fear of current or ex partner: Not on file    Emotionally abused: Not on file    Physically abused: Not on file    Forced sexual activity: Not on file  Other Topics Concern  . Not on file  Social History Narrative   Lives alone in a 2 story home.  No children.  Right handed.  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  Education: doctorate.     Review of Systems  Constitutional: Negative.   HENT: Negative.   Eyes: Negative.   Respiratory: Negative.   Cardiovascular: Negative.   Gastrointestinal: Negative.   Genitourinary: Negative.   Musculoskeletal: Negative.   Skin: Negative.   Neurological: Negative.   Endo/Heme/Allergies: Negative.   Psychiatric/Behavioral: Negative.     PHYSICAL EXAMINATION:    BP 112/70 (BP Location: Right Arm, Patient Position: Sitting, Cuff Size: Normal)   Pulse 100   Temp (!) 97.3 F (36.3 C) (Skin)   Wt 191 lb 3.2 oz (86.7 kg)   BMI 31.82 kg/m     General appearance: alert, cooperative and appears stated age  Pelvic: External genitalia:  no lesions              Urethra:  normal appearing urethra with no masses, tenderness or lesions               Bartholins and Skenes: normal                 Vagina: normal appearing vagina with normal color and discharge, no lesions              Cervix: no lesions and IUD string 3 cm              Bimanual Exam:  Uterus:  normal size, contour, position, consistency, mobility, non-tender              Adnexa: no mass, fullness, tenderness                Chaperone was present for exam.  ASSESSMENT Mirena IUD check    PLAN Moving out of State.  Call with any concerns   An After Visit Summary was printed and given to the patient.

## 2019-02-13 NOTE — Progress Notes (Signed)
Jill Frank    [id number]    1975-12-16  Primary Care Physician:Wallace, Danae Chen, DO  Referring Physician: Briscoe Deutscher, [address] [place], [place] [id number]  This service was provided via audio and video telemedicine ([place]) due to [place] 19 pandemic.  Patient location: Home Provider location: Office Used 2 patient identifiers to confirm the correct person. Explained the limitations in evaluation and management via telemedicine. Patient is aware of potential medical charges for this visit.  Patient consented to this virtual visit.  The persons participating in this telemedicine service were myself and the patient  Interactive audio and video telecommunications were attempted between this provider and patient, however failed, due to patient having technical difficulties OR patient did not have access to video capability. We continued and completed visit with audio only.    Chief complaint: Rectal bleeding HPI:  43 year old female with complaints of intermittent bright red blood per rectum, mostly notices it with bowel movement when she wipes and also coating the stool at times but never mixed in stool.  Denies any rectal or abdominal pain.  No significant change in bowel habits. No nausea, vomiting or melena.  No weight loss. Last year she had rectal bleeding associated with rectal discomfort, was diagnosed with anal fissure and had anal skin tag removed by Dr. Lucia Gaskins at [facility]. she was doing well until recently.  Mother had pre cancerous polyps in her 30s and 38s, was getting frequent colonoscopies. Colon cancer in paternal grandfather. Father had surgery for anal fissure.  She is changing jobs and her current insurance ends in May 2020.  Requesting colonoscopy before end of May.   Outpatient Encounter Medications as of 02/13/2019  Medication Sig  . acidophilus (RISAQUAD) CAPS capsule Take by mouth daily.  Marland Kitchen ALPRAZolam (XANAX) 0.5 MG tablet Take 0.5  mg by mouth as needed for anxiety (For air travel.).  Marland Kitchen azelastine (ASTELIN) 0.1 % nasal spray Place 1 spray into both nostrils daily. Use in each nostril as directed  . buPROPion (WELLBUTRIN XL) 150 MG 24 hr tablet Take 150 mg by mouth daily.  . cetirizine (ZYRTEC) 10 MG tablet Take 10 mg by mouth daily.  . cholecalciferol (VITAMIN D3) 25 MCG (1000 UT) tablet Take 1,000 Units by mouth daily.  . clonazePAM (KLONOPIN) 0.5 MG tablet Take 1 tablet (0.5 mg total) by mouth 2 (two) times daily as needed for anxiety.  . Cyanocobalamin (VITAMIN B 12 PO) Take by mouth daily.   . DULoxetine (CYMBALTA) 60 MG capsule Take 60 mg by mouth daily.  . fluconazole (DIFLUCAN) 100 MG tablet One tab three times a week  . Fluocin-Hydroquinone-Tretinoin (TRI-LUMA) 0.01-4-0.05 % CREA Apply daily as needed.  . fluticasone (FLONASE) 50 MCG/ACT nasal spray Place 1 spray into both nostrils daily.  Marland Kitchen gabapentin (NEURONTIN) 300 MG capsule TAKE 1 CAPSULE 3 TIMES     DAILY AS NEEDED  . levonorgestrel (MIRENA) 20 MCG/24HR IUD 1 each by Intrauterine route once.  . meloxicam (MOBIC) 15 MG tablet Take 1 tablet (15 mg total) by mouth daily.  . metroNIDAZOLE (METROGEL) 2.94 % gel 1 APPLICATOR TWICE WEEKLY X 6 MONTHS FOR RECURRENT BV.  Marland Kitchen Misc Natural Products (GLUCOSAMINE CHOND DOUBLE STR PO)   . montelukast (SINGULAIR) 10 MG tablet Take 1 tablet (10 mg total) by mouth at bedtime.  . Multiple Vitamins-Minerals (VITAMIN D3 COMPLETE PO) Take by mouth.  . nitroGLYCERIN (NITRODUR - DOSED IN MG/24 HR) 0.2 mg/hr patch Place  1/4 to 1/2 of a patch over affected region. Remove and replace once daily.  Slightly alter skin placement daily  . olmesartan (BENICAR) 40 MG tablet Take 1 tablet (40 mg total) by mouth daily.  Marland Kitchen terazosin (HYTRIN) 10 MG capsule Take 1 capsule (10 mg total) by mouth at bedtime.  . TURMERIC PO   . valACYclovir (VALTREX) 500 MG tablet 1 tablet po daily or take one tablet po bid x 3 days for outbreak recurrence.  .  vortioxetine HBr (TRINTELLIX) 20 MG TABS tablet   . VYVANSE 70 MG capsule Take 1 capsule by mouth daily.   No facility-administered encounter medications on file as of 02/13/2019.     Allergies as of 02/13/2019 - Review Complete 02/13/2019  Allergen Reaction Noted  . Doxycycline Hives 06/21/2017  . Erythromycin Hives 06/28/2017  . Penicillins Hives 06/21/2017    Past Medical History:  Diagnosis Date  . Abnormal Pap smear of cervix 05/2013   --had colposcopy for LGSIL but no treatment to cervix  . ADHD   . Anxiety   . Arthritis   . Cancer ([place]) 1998   back  . Depression   . Fibroadenoma    Rt.Breast  . Hyperhidrosis 06/23/2017  . Hyperhidrosis   . Hypertension   . Melasma   . Obstructive sleep apnea   . Piriformis syndrome   . PTSD (post-traumatic stress disorder)   . Sacroiliitis ([place])   . Seasonal allergic rhinitis due to pollen 06/23/2017  . Vitamin D deficiency     Past Surgical History:  Procedure Laterality Date  . BREAST BIOPSY Right    benign  . BREAST SURGERY     breast biopsy  . C3-4 ACDF  12/14/2018  . COLPOSCOPY  05/2013   LGSIL--no treatment  . excision of melanoma  1998   --back  . HIP ARTHROSCOPY W/ LABRAL REPAIR Left 11/2013  . KNEE ARTHROSCOPY  02/2013    Family History  Problem Relation Age of Onset  . Breast cancer Maternal Aunt 58       A & W  . Breast cancer Maternal Grandmother 45       mets to brain  . Autoimmune disease Mother   . Anxiety disorder Father   . CAD Father   . Other Father        dyslipidemia  . Bipolar disorder Sister   . ADD / ADHD Sister   . Thyroid disease Maternal Grandfather   . Cancer Paternal Grandfather 74       dec colon ca    Social History   Socioeconomic History  . Marital status: Single    Spouse name: Not on file  . Number of children: 0  . Years of education: Not on file  . Highest education level: Doctorate  Occupational History  . Occupation: Programme researcher, broadcasting/film/video    Employer: [employer]  Social Needs  . Financial resource strain: Not on file  . Food insecurity:    Worry: Not on file    Inability: Not on file  . Transportation needs:    Medical: Not on file    Non-medical: Not on file  Tobacco Use  . Smoking status: Never Smoker  . Smokeless tobacco: Never Used  Substance and Sexual Activity  . Alcohol use: Yes    Comment: Social --1 per week  . Drug use: No  . Sexual activity: Yes    Birth control/protection: I.U.D.    Comment: Mirena IUD inserted 02/2014  Lifestyle  .  Physical activity:    Days per week: Not on file    Minutes per session: Not on file  . Stress: Not on file  Relationships  . Social connections:    Talks on phone: Not on file    Gets together: Not on file    Attends religious service: Not on file    Active member of club or organization: Not on file    Attends meetings of clubs or organizations: Not on file    Relationship status: Not on file  . Intimate partner violence:    Fear of current or ex partner: Not on file    Emotionally abused: Not on file    Physically abused: Not on file    Forced sexual activity: Not on file  Other Topics Concern  . Not on file  Social History Narrative   Lives alone in a 2 story home.  No children.  Right handed.  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       Review of systems: Review of Systems as per HPI All other systems reviewed and are negative.   Physical Exam: Vitals were not taken and physical exam was not performed during this virtual visit.  Data Reviewed:  Reviewed labs, radiology imaging, old records and pertinent past GI work up   Assessment and Plan/Recommendations:  43 year old female with intermittent rectal bleeding Family history positive for precancerous colon polyps in mother and colon cancer in paternal grandfather. Most likely etiology bleeding from internal hemorrhoids but will need to exclude neoplastic lesion/malignancy Schedule for  colonoscopy for further evaluation The risks and benefits as well as alternatives of endoscopic procedure(s) have been discussed and reviewed. All questions answered. The patient agrees to proceed.     Damaris Hippo , MD   CC: Briscoe Deutscher, DO

## 2019-02-13 NOTE — Patient Instructions (Addendum)
You have been scheduled for a colonoscopy. Please follow written instructions given to you at your visit today.  Please pick up your prep supplies at the pharmacy within the next 1-3 days. If you use inhalers (even only as needed), please bring them with you on the day of your procedure.   We will give you a sample Plenvu prep kit today   I appreciate the  opportunity to care for you  Thank You   Harl Bowie , MD

## 2019-02-14 ENCOUNTER — Other Ambulatory Visit: Payer: BC Managed Care – PPO

## 2019-02-14 ENCOUNTER — Ambulatory Visit (AMBULATORY_SURGERY_CENTER): Payer: BC Managed Care – PPO | Admitting: Gastroenterology

## 2019-02-14 ENCOUNTER — Ambulatory Visit (INDEPENDENT_AMBULATORY_CARE_PROVIDER_SITE_OTHER): Payer: BC Managed Care – PPO | Admitting: Obstetrics and Gynecology

## 2019-02-14 ENCOUNTER — Ambulatory Visit (INDEPENDENT_AMBULATORY_CARE_PROVIDER_SITE_OTHER): Payer: BC Managed Care – PPO | Admitting: Plastic Surgery

## 2019-02-14 ENCOUNTER — Encounter: Payer: Self-pay | Admitting: Gastroenterology

## 2019-02-14 ENCOUNTER — Encounter: Payer: Self-pay | Admitting: Obstetrics and Gynecology

## 2019-02-14 ENCOUNTER — Encounter: Payer: Self-pay | Admitting: Plastic Surgery

## 2019-02-14 ENCOUNTER — Other Ambulatory Visit: Payer: Self-pay

## 2019-02-14 VITALS — BP 112/70 | HR 100 | Temp 97.3°F | Wt 191.2 lb

## 2019-02-14 VITALS — BP 108/64 | HR 64 | Temp 99.8°F | Resp 22 | Ht 65.0 in | Wt 188.0 lb

## 2019-02-14 VITALS — BP 110/73 | HR 67 | Temp 98.8°F | Ht 65.0 in | Wt 187.0 lb

## 2019-02-14 DIAGNOSIS — R3915 Urgency of urination: Secondary | ICD-10-CM

## 2019-02-14 DIAGNOSIS — Z30431 Encounter for routine checking of intrauterine contraceptive device: Secondary | ICD-10-CM | POA: Diagnosis not present

## 2019-02-14 DIAGNOSIS — R61 Generalized hyperhidrosis: Secondary | ICD-10-CM | POA: Diagnosis not present

## 2019-02-14 DIAGNOSIS — D123 Benign neoplasm of transverse colon: Secondary | ICD-10-CM

## 2019-02-14 DIAGNOSIS — K648 Other hemorrhoids: Secondary | ICD-10-CM | POA: Diagnosis not present

## 2019-02-14 DIAGNOSIS — K625 Hemorrhage of anus and rectum: Secondary | ICD-10-CM

## 2019-02-14 DIAGNOSIS — K635 Polyp of colon: Secondary | ICD-10-CM

## 2019-02-14 LAB — URINALYSIS, ROUTINE W REFLEX MICROSCOPIC
Bilirubin Urine: NEGATIVE
Hgb urine dipstick: NEGATIVE
Leukocytes,Ua: NEGATIVE
Nitrite: NEGATIVE
RBC / HPF: NONE SEEN (ref 0–?)
Specific Gravity, Urine: 1.015 (ref 1.000–1.030)
Total Protein, Urine: NEGATIVE
Urine Glucose: NEGATIVE
Urobilinogen, UA: 0.2 (ref 0.0–1.0)
WBC, UA: NONE SEEN (ref 0–?)
pH: 5 (ref 5.0–8.0)

## 2019-02-14 MED ORDER — SODIUM CHLORIDE 0.9 % IV SOLN: 500.0000 mL | Freq: Once | INTRAVENOUS | Status: AC

## 2019-02-14 NOTE — Progress Notes (Signed)
PT taken to PACU. Monitors in place. VSS. Report given to RN. 

## 2019-02-14 NOTE — Progress Notes (Signed)
Riki Sheer, LPN - Temp/VS

## 2019-02-14 NOTE — Patient Instructions (Signed)
Thank you for allowing [place] to care for you today!  Await pathology results by mail, approximately 2 weeks.  Recommend next colonoscopy results final ( 7-10 years).  Resume previous diet and medications today.  Return to normal activities tomorrow.  Handout for polyps provided.  Supplemental fiber as discussed.     YOU HAD AN ENDOSCOPIC PROCEDURE TODAY AT [facility]:   Refer to the procedure report that was given to you for any specific questions about what was found during the examination.  If the procedure report does not answer your questions, please call your gastroenterologist to clarify.  If you requested that your care partner not be given the details of your procedure findings, then the procedure report has been included in a sealed envelope for you to review at your convenience later.  YOU SHOULD EXPECT: Some feelings of bloating in the abdomen. Passage of more gas than usual.  Walking can help get rid of the air that was put into your GI tract during the procedure and reduce the bloating. If you had a lower endoscopy (such as a colonoscopy or flexible sigmoidoscopy) you may notice spotting of blood in your stool or on the toilet paper. If you underwent a bowel prep for your procedure, you may not have a normal bowel movement for a few days.  Please Note:  You might notice some irritation and congestion in your nose or some drainage.  This is from the oxygen used during your procedure.  There is no need for concern and it should clear up in a day or so.  SYMPTOMS TO REPORT IMMEDIATELY:   Following lower endoscopy (colonoscopy or flexible sigmoidoscopy):  Excessive amounts of blood in the stool  Significant tenderness or worsening of abdominal pains  Swelling of the abdomen that is new, acute  Fever of 100F or higher   \ For urgent or emergent issues, a gastroenterologist can be reached at any hour by calling [phone].   DIET:  We do recommend a small  meal at first, but then you may proceed to your regular diet.  Drink plenty of fluids but you should avoid alcoholic beverages for 24 hours.  ACTIVITY:  You should plan to take it easy for the rest of today and you should NOT DRIVE or use heavy machinery until tomorrow (because of the sedation medicines used during the test).    FOLLOW UP: Our staff will call the number listed on your records 48-72 hours following your procedure to check on you and address any questions or concerns that you may have regarding the information given to you following your procedure. If we do not reach you, we will leave a message.  We will attempt to reach you two times.  During this call, we will ask if you have developed any symptoms of COVID 19. If you develop any symptoms (for example fever, flu-like symptoms, shortness of breath, cough etc.) before then, please call [phone].  If any biopsies were taken you will be contacted by phone or by letter within the next 1-3 weeks.  Please call us at [phone] if you have not heard about the biopsies in 3 weeks.    SIGNATURES/CONFIDENTIALITY: You and/or your care partner have signed paperwork which will be entered into your electronic medical record.  These signatures attest to the fact that that the information above on your After Visit Summary has been reviewed and is understood.  Full responsibility of the confidentiality of this discharge information  lies with you and/or your care-partner.

## 2019-02-14 NOTE — Op Note (Signed)
[place] Patient Name: Jill Frank Procedure Date: 02/14/2019 2:32 PM MRN: [id number] Endoscopist: Mauri Pole , MD Age: 43 Referring MD:  Date of Birth: March 31, 1976 Gender: Female Account #: [id number] Procedure:                Colonoscopy Indications:              Evaluation of unexplained GI bleeding presenting                            with Hematochezia Medicines:                Monitored Anesthesia Care Procedure:                Pre-Anesthesia Assessment:                           - Prior to the procedure, a History and Physical                            was performed, and patient medications and                            allergies were reviewed. The patient's tolerance of                            previous anesthesia was also reviewed. The risks                            and benefits of the procedure and the sedation                            options and risks were discussed with the patient.                            All questions were answered, and informed consent                            was obtained. Prior Anticoagulants: The patient has                            taken no previous anticoagulant or antiplatelet                            agents. ASA Grade Assessment: II - A patient with                            mild systemic disease. After reviewing the risks                            and benefits, the patient was deemed in                            satisfactory condition to undergo the procedure.  After obtaining informed consent, the colonoscope                            was passed under direct vision. Throughout the                            procedure, the patient's blood pressure, pulse, and                            oxygen saturations were monitored continuously. The                            Colonoscope was introduced through the anus and                            advanced to the the cecum, identified  by                            appendiceal orifice and ileocecal valve. The                            colonoscopy was performed without difficulty. The                            patient tolerated the procedure well. The quality                            of the bowel preparation was adequate. The                            ileocecal valve, appendiceal orifice, and rectum                            were photographed. Scope In: 2:48:51 PM Scope Out: 3:05:37 PM Scope Withdrawal Time: 0 hours 12 minutes 43 seconds  Total Procedure Duration: 0 hours 16 minutes 46 seconds  Findings:                 The perianal and digital rectal examinations were                            normal.                           A 2 mm polyp was found in the transverse colon. The                            polyp was sessile. The polyp was removed with a                            cold biopsy forceps. Resection and retrieval were                            complete.  Non-bleeding internal hemorrhoids were found during                            retroflexion. The hemorrhoids were medium-sized. Complications:            No immediate complications. Impression:               - One 2 mm polyp in the transverse colon, removed                            with a cold biopsy forceps. Resected and retrieved.                           - Non-bleeding internal hemorrhoids likely etiology                            of rectal bleeding. Recommendation:           - Patient has a contact number available for                            emergencies. The signs and symptoms of potential                            delayed complications were discussed with the                            patient. Return to normal activities tomorrow.                            Written discharge instructions were provided to the                            patient.                           - Resume previous diet.                            - Continue present medications.                           - Await pathology results.                           - Repeat colonoscopy in 7-10 years for surveillance                            based on pathology results. Mauri Pole, MD 02/14/2019 3:28:44 PM This report has been signed electronically.

## 2019-02-14 NOTE — Progress Notes (Signed)
Patient reported urinary symptoms and leaving town tomorrow.  Dr Silverio Decamp approved for patient to have u/a after colonoscopy.  Assisted patient to lab then to parking lot via wheelchair.

## 2019-02-14 NOTE — Progress Notes (Signed)
Called to room to assist during endoscopic procedure.  Patient ID and intended procedure confirmed with present staff. Received instructions for my participation in the procedure from the performing physician.  

## 2019-02-14 NOTE — Progress Notes (Signed)
Patient ID: Jill Frank, female    DOB: October 31, 1975, 43 y.o.   MRN: [id number]   Chief Complaint  Patient presents with  . Skin Problem    The patient is a 43 year old female here for evaluation of chronic idiopathic primary hyperhidrosis.  She has been treated for this in the past with conservative measures that failed.  She takes Terazosin and which is sometimes helpful she also uses Drysol but even with these measures she still has the hyperhidrosis.  It is often so bad that her hair drips wet and she has difficulty keeping her close from getting saturated with sweat.  For the past several years while she was living in [place] she underwent treatment of the axilla and scalp with Botox.  This was extremely helpful and she found that she was able to do it twice a year to keep things under control.  She has complained that with out it that the sweat is so bad it impairs her vision as it runs down her forehead.  She has an inability at times for work or activity due to the sweat dripping from her face neck trunk legs which saturates her close.  She has a past medical history as listed below and includes anxiety chondromalacia attention deficit disorder depression fibrocystic breast disease history of melanoma in situ melasma.  She is not a smoker.  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  The regimen that has helped in the past is 200 international units of Botox to the scalp and 50 units of Botox to each axilla.   Review of Systems  Constitutional: Negative for activity change and appetite change.  Eyes: Negative.  Negative for discharge.  Respiratory: Negative for cough and shortness of breath.   Cardiovascular: Negative for leg swelling.  Gastrointestinal: Negative for abdominal distention and abdominal pain.  Endocrine: Negative.   Genitourinary: Negative.   Musculoskeletal: Positive for back pain.  Skin: Negative for wound.  Neurological: Negative.    Psychiatric/Behavioral: The patient is nervous/anxious.     Past Medical History:  Diagnosis Date  . Abnormal Pap smear of cervix 05/2013   --had colposcopy for LGSIL but no treatment to cervix  . ADHD   . Anxiety   . Arthritis   . 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 Depression   . Fibroadenoma    Rt.Breast  . Hyperhidrosis 06/23/2017  . Hyperhidrosis   . Hypertension   . Melasma   . Obstructive sleep apnea   . Piriformis syndrome   . PTSD (post-traumatic stress disorder)   . Sacroiliitis ([place])   . Seasonal allergic rhinitis due to pollen 06/23/2017  . Vitamin D deficiency     Past Surgical History:  Procedure Laterality Date  . BREAST BIOPSY Right    benign  . BREAST SURGERY     breast biopsy  . C3-4 ACDF  12/14/2018  . COLPOSCOPY  05/2013   LGSIL--no treatment  . excision of melanoma  1998   --back  . HIP ARTHROSCOPY W/ LABRAL REPAIR Left 11/2013  . KNEE ARTHROSCOPY  02/2013      Current Outpatient Medications:  .  acidophilus (RISAQUAD) CAPS capsule, Take by mouth daily., Disp: , Rfl:  .  ALPRAZolam (XANAX) 0.5 MG tablet, Take 0.5 mg by mouth as needed for anxiety (For air travel.)., Disp: , Rfl:  .  azelastine (ASTELIN) 0.1 % nasal spray, Place 1 spray into both nostrils daily. Use in each nostril as directed, Disp:  30 mL, Rfl: 3 .  buPROPion (WELLBUTRIN XL) 150 MG 24 hr tablet, Take 150 mg by mouth daily., Disp: , Rfl:  .  cetirizine (ZYRTEC) 10 MG tablet, Take 10 mg by mouth daily., Disp: , Rfl:  .  cholecalciferol (VITAMIN D3) 25 MCG (1000 UT) tablet, Take 1,000 Units by mouth daily., Disp: , Rfl:  .  clonazePAM (KLONOPIN) 0.5 MG tablet, Take 1 tablet (0.5 mg total) by mouth 2 (two) times daily as needed for anxiety., Disp: 180 tablet, Rfl: 0 .  Cyanocobalamin (VITAMIN B 12 PO), Take by mouth daily. , Disp: , Rfl:  .  DULoxetine (CYMBALTA) 60 MG capsule, Take 60 mg by mouth daily., Disp: , Rfl:  .  fluconazole (DIFLUCAN) 100 MG tablet, One tab three times a week,  Disp: 36 tablet, Rfl: 0 .  Fluocin-Hydroquinone-Tretinoin (TRI-LUMA) 0.01-4-0.05 % CREA, Apply daily as needed., Disp: 90 g, Rfl: 1 .  fluticasone (FLONASE) 50 MCG/ACT nasal spray, Place 1 spray into both nostrils daily., Disp: 16 g, Rfl: 2 .  gabapentin (NEURONTIN) 300 MG capsule, TAKE 1 CAPSULE 3 TIMES     DAILY AS NEEDED, Disp: 270 capsule, Rfl: 0 .  levonorgestrel (MIRENA) 20 MCG/24HR IUD, 1 each by Intrauterine route once., Disp: , Rfl:  .  meloxicam (MOBIC) 15 MG tablet, Take 1 tablet (15 mg total) by mouth daily., Disp: 90 tablet, Rfl: 1 .  metroNIDAZOLE (METROGEL) 2.22 % gel, 1 APPLICATOR TWICE WEEKLY X 6 MONTHS FOR RECURRENT BV., Disp: 135 g, Rfl: 0 .  Misc Natural Products (GLUCOSAMINE CHOND DOUBLE STR PO), , Disp: , Rfl:  .  montelukast (SINGULAIR) 10 MG tablet, Take 1 tablet (10 mg total) by mouth at bedtime., Disp: 90 tablet, Rfl: 1 .  Multiple Vitamins-Minerals (VITAMIN D3 COMPLETE PO), Take by mouth., Disp: , Rfl:  .  nitroGLYCERIN (NITRODUR - DOSED IN MG/24 HR) 0.2 mg/hr patch, Place 1/4 to 1/2 of a patch over affected region. Remove and replace once daily.  Slightly alter skin placement daily, Disp: 90 patch, Rfl: 0 .  olmesartan (BENICAR) 40 MG tablet, Take 1 tablet (40 mg total) by mouth daily., Disp: 90 tablet, Rfl: 1 .  terazosin (HYTRIN) 10 MG capsule, Take 1 capsule (10 mg total) by mouth at bedtime., Disp: 90 capsule, Rfl: 1 .  TURMERIC PO, , Disp: , Rfl:  .  valACYclovir (VALTREX) 500 MG tablet, 1 tablet po daily or take one tablet po bid x 3 days for outbreak recurrence., Disp: 110 tablet, Rfl: 3 .  vortioxetine HBr (TRINTELLIX) 20 MG TABS tablet, , Disp: , Rfl:  .  VYVANSE 70 MG capsule, Take 1 capsule by mouth daily., Disp: , Rfl: 0   Objective:   Vitals:   02/14/19 0911  BP: 110/73  Pulse: 67  Temp: 98.8 F (37.1 C)  SpO2: 98%    Physical Exam Vitals signs and nursing note reviewed.  Constitutional:      Appearance: Normal appearance.  HENT:     Head:  Normocephalic and atraumatic.  Neck:     Musculoskeletal: Normal range of motion.  Cardiovascular:     Rate and Rhythm: Normal rate.     Pulses: Normal pulses.  Pulmonary:     Effort: Pulmonary effort is normal. No respiratory distress.  Abdominal:     General: Abdomen is flat. There is no distension.  Skin:    General: Skin is warm.     Capillary Refill: Capillary refill takes less than 2 seconds.  Coloration: Skin is not jaundiced.     Findings: No bruising.  Neurological:     General: No focal deficit present.     Mental Status: She is alert and oriented to person, place, and time.  Psychiatric:        Mood and Affect: Mood normal.        Behavior: Behavior normal.     Assessment & Plan:  Hyperhidrosis Recommend Botox treatment to scalp and bilateral axilla.  Most likely a total of 300 international units every 6 months. Pictures taken with patient permission and placed in the chart.  [place], DO

## 2019-02-15 ENCOUNTER — Ambulatory Visit: Payer: Self-pay | Admitting: Obstetrics and Gynecology

## 2019-02-15 ENCOUNTER — Encounter: Payer: Self-pay | Admitting: Gastroenterology

## 2019-02-15 ENCOUNTER — Ambulatory Visit: Payer: Self-pay

## 2019-02-15 NOTE — Telephone Encounter (Signed)
Response added to another open message.

## 2019-02-16 ENCOUNTER — Ambulatory Visit: Payer: BC Managed Care – PPO | Admitting: Obstetrics and Gynecology

## 2019-02-16 ENCOUNTER — Telehealth: Payer: Self-pay | Admitting: *Deleted

## 2019-02-16 NOTE — Telephone Encounter (Signed)
1. Have you developed a fever since your procedure? no  2.   Have you had an respiratory symptoms (SOB or cough) since your procedure? no  3.   Have you tested positive for COVID 19 since your procedure no  3.   Have you had any family members/close contacts diagnosed with the COVID 19 since your procedure?  no   If any of these questions are a yes, please inquire if patient has been seen by family doctor and route this note to Joylene John, Therapist, sports.    Follow up Call-  Call back number 02/14/2019  Post procedure Call Back phone  # [phone] cell  Permission to leave phone message Yes     Patient questions:  Do you have a fever, pain , or abdominal swelling? No. Pain Score  0 *  Have you tolerated food without any problems? Yes.    Have you been able to return to your normal activities? Yes.    Do you have any questions about your discharge instructions: Diet   No. Medications  No. Follow up visit  No.  Do you have questions or concerns about your Care? No. PT did say that she felt like she had a "really bad hangover" for 24-36 hours post procedure.  Actions: * If pain score is 4 or above: No action needed, pain <4.

## 2019-02-22 ENCOUNTER — Encounter: Payer: Self-pay | Admitting: Gastroenterology

## 2019-02-23 ENCOUNTER — Encounter: Payer: Self-pay | Admitting: Family Medicine

## 2019-02-23 ENCOUNTER — Ambulatory Visit (INDEPENDENT_AMBULATORY_CARE_PROVIDER_SITE_OTHER): Payer: BC Managed Care – PPO | Admitting: Family Medicine

## 2019-02-23 DIAGNOSIS — L0390 Cellulitis, unspecified: Secondary | ICD-10-CM | POA: Diagnosis not present

## 2019-02-23 DIAGNOSIS — L600 Ingrowing nail: Secondary | ICD-10-CM | POA: Diagnosis not present

## 2019-02-23 MED ORDER — CLINDAMYCIN HCL 150 MG PO CAPS: 450.0000 mg | ORAL_CAPSULE | Freq: Three times a day (TID) | ORAL | 0 refills | Status: AC

## 2019-02-23 NOTE — Progress Notes (Signed)
   Chief Complaint:  Jill Frank is a 43 y.o. female who presents today for a virtual office visit with a chief complaint of toe infection.   Assessment/Plan:  Cellulitis/ingrown toe No red flags.  No signs of systemic infection.  Area is draining spontaneously.  She has a history of allergy to penicillin and doxycycline.  Will start clindamycin for 10-day course.  Discussed reasons to return to care.  No improvement with above, will likely need nail removal and/your incision and drainage of the area.    Subjective:  HPI:  Toe Infection Symptoms started a few days ago.  Located to the medial fold of her right great toe.  She has had some associated pain, drainage, and swelling.  She has tried topical antibiotics with no improvement.  She has an ingrown toenail in the area and has been "working on it".  She thinks that this caused the infection.  No reported fevers or chills.  No reported body aches.  No other specific treatments tried.  No other obvious alleviating or aggravating factors.  ROS: Per HPI  PMH: She reports that she has never smoked. She has never used smokeless tobacco. She reports current alcohol use. She reports that she does not use drugs.      Objective/Observations  Physical Exam: Gen: NAD, resting comfortably Pulm: Normal work of breathing Neuro: Grossly normal, moves all extremities Psych: Normal affect and thought content MSK: Right great toe with erythema and edema to medial nail fold.  Virtual Visit via Video   I connected with Jill Frank on 02/23/19 at  2:40 PM EDT by a video enabled telemedicine application and verified that I am speaking with the correct person using two identifiers. I discussed the limitations of evaluation and management by telemedicine and the availability of in person appointments. The patient expressed understanding and agreed to proceed.   Patient location: Home Provider location: [place]  participating in the virtual visit: Myself and Patient     Algis Greenhouse. Jerline Pain, MD 02/23/2019 3:02 PM

## 2019-03-06 ENCOUNTER — Encounter: Payer: Self-pay | Admitting: Family Medicine

## 2019-03-08 ENCOUNTER — Other Ambulatory Visit: Payer: Self-pay

## 2019-03-08 MED ORDER — CIPROFLOXACIN HCL 250 MG PO TABS: 250.0000 mg | ORAL_TABLET | Freq: Two times a day (BID) | ORAL | 0 refills | Status: AC

## 2019-03-16 ENCOUNTER — Ambulatory Visit: Payer: BC Managed Care – PPO | Admitting: Family Medicine

## 2020-06-28 IMAGING — MR MR CERVICAL SPINE W/O CM
5 series · 29 of 48 positions shown · non-contrast
Comparison: None.

CLINICAL DATA: Left arm pain and weakness.  Neck pain and stiffness

EXAM:
MRI CERVICAL SPINE WITHOUT CONTRAST
TECHNIQUE: Multiplanar, multisequence MR imaging of the cervical spine was
performed. No intravenous contrast was administered.

[Series 3: T2 · sagittal · 3.0mm · 0.41mm/px · 6 of 13 slices shown (1 of 2)]
[im 1/13]
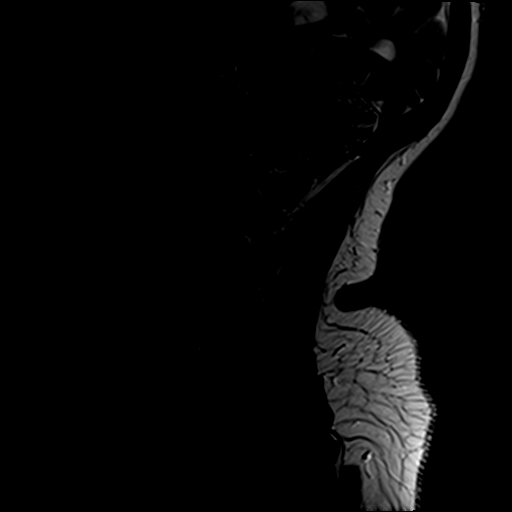
[im 3/13]
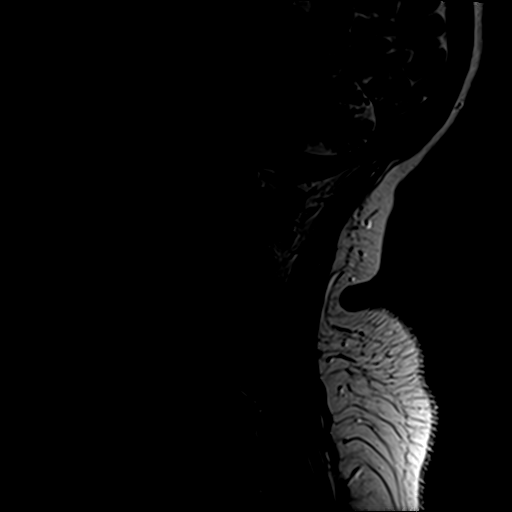
[im 5/13]
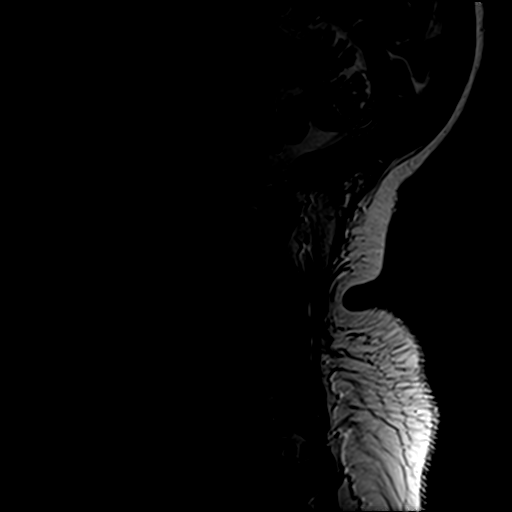
[im 8/13]
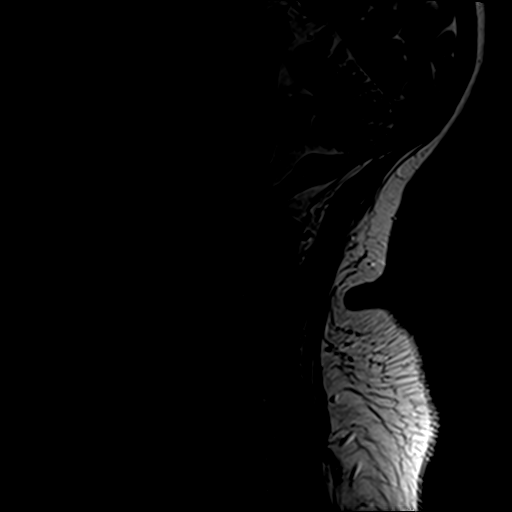
[im 10/13]
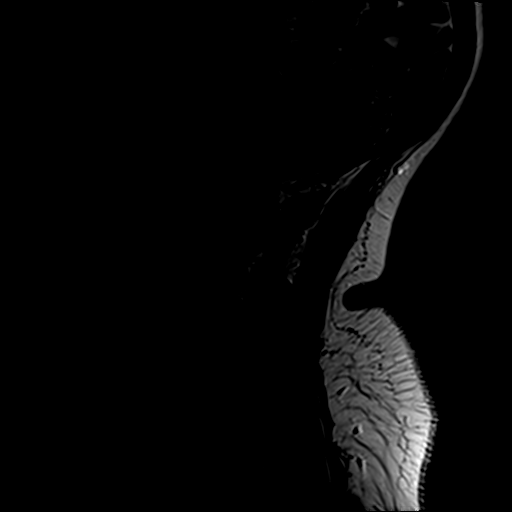
[im 13/13]
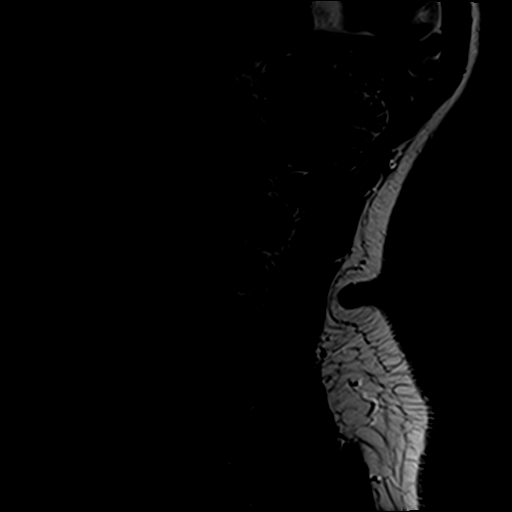

[Series 4: T1 · sagittal · 3.0mm · 0.41mm/px · 6 of 13 slices shown]
[im 1/13]
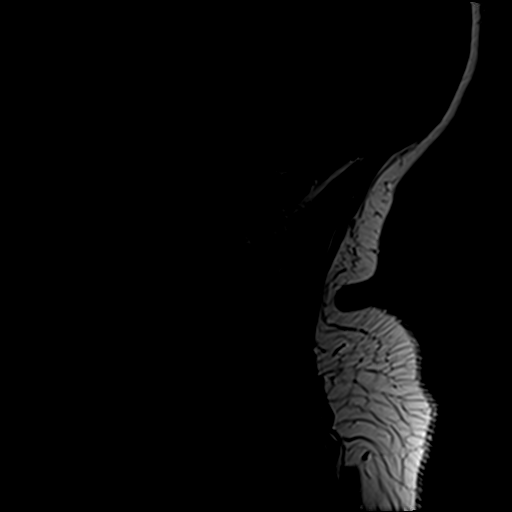
[im 3/13]
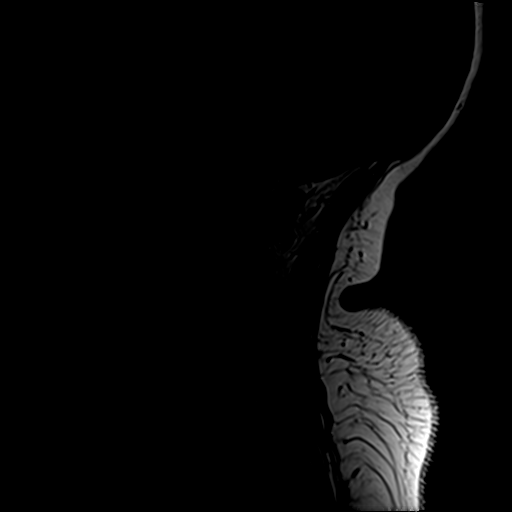
[im 5/13]
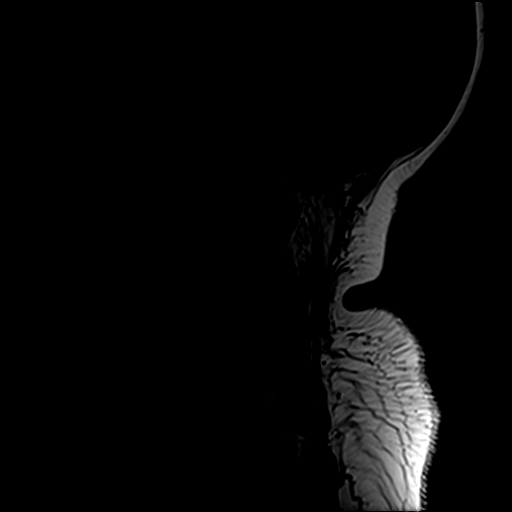
[im 8/13]
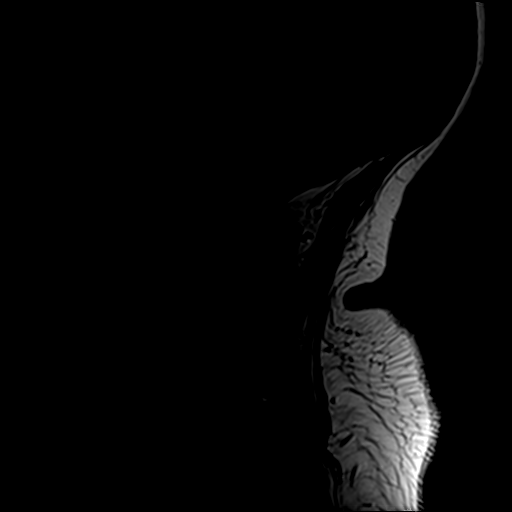
[im 10/13]
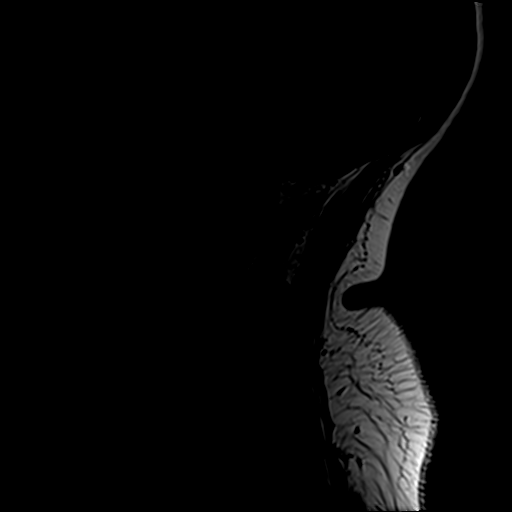
[im 13/13]
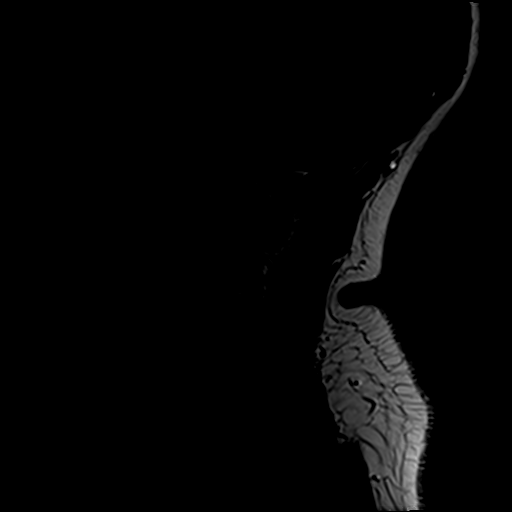

[Series 5: STIR · sagittal · 3.0mm · 0.82mm/px · 6 of 13 slices shown]
[im 1/13]
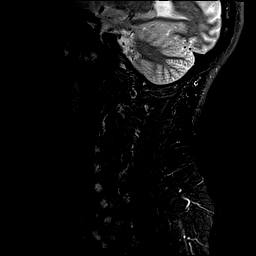
[im 3/13]
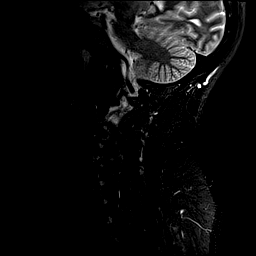
[im 5/13]
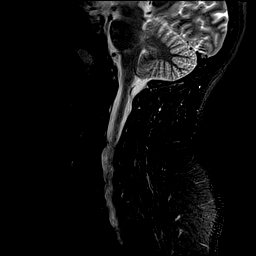
[im 8/13]
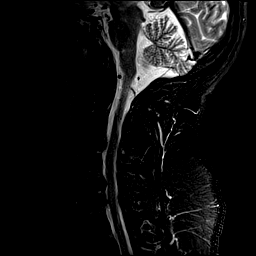
[im 10/13]
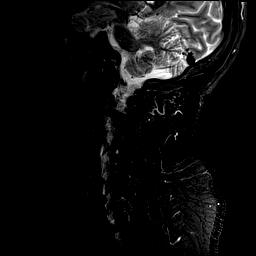
[im 13/13]
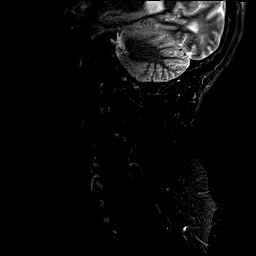

[Series 6: GRE · axial · 3.0mm · 0.35mm/px · z∈[-139,-124]mm · 2 of 34 slices shown]
[im 1/34]
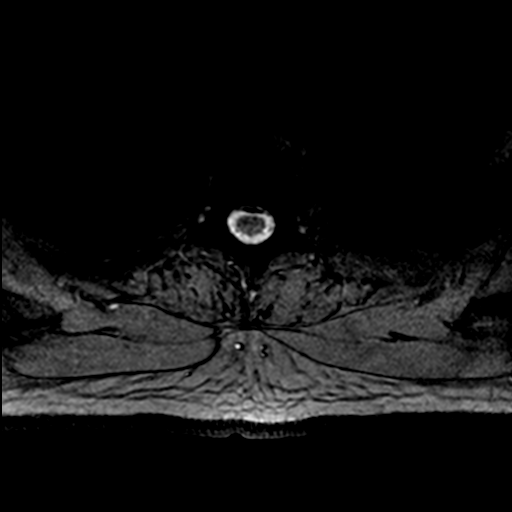
[im 5/34]
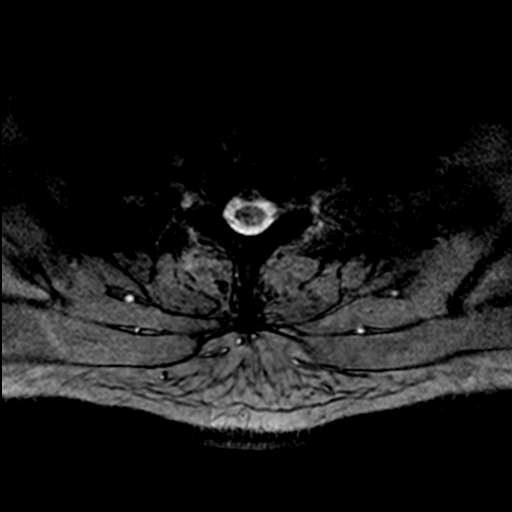

[Series 7: T2 · axial · 3.0mm · 0.70mm/px · z∈[-139,-14]mm · 9 of 34 slices shown (2 of 2)]
[im 1/34]
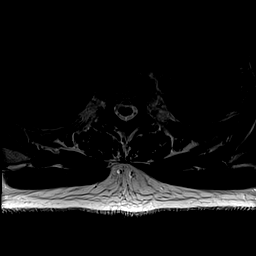
[im 5/34]
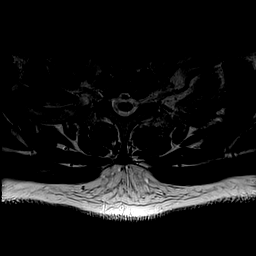
[im 10/34]
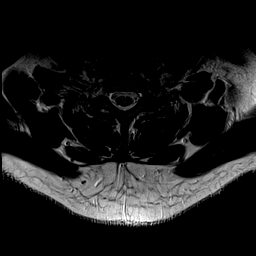
[im 15/34]
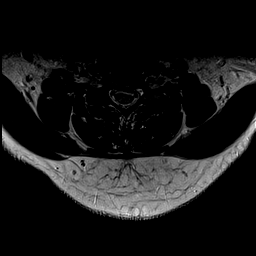
[im 17/34]
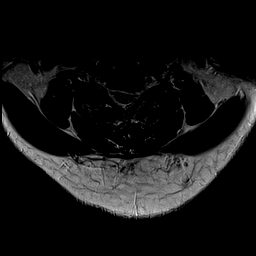
[im 19/34]
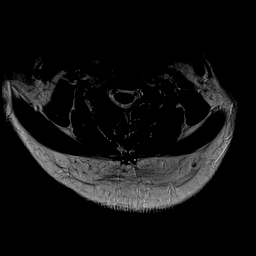
[im 24/34]
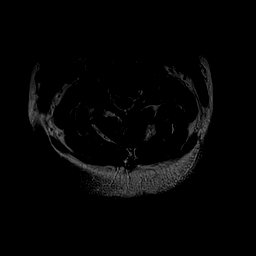
[im 29/34]
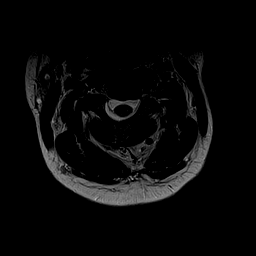
[im 34/34]
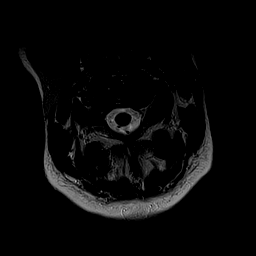

[29 of 48 positions shown; findings below may reference images not displayed]

FINDINGS: Alignment: Mild degenerative C4-5 anterolisthesis

Vertebrae: No fracture, evidence of discitis, or bone lesion.

Cord: Normal signal and morphology.

Posterior Fossa, vertebral arteries, paraspinal tissues: Negative.

Disc levels:

C2-3: Degenerative facet spurring asymmetric to the right. Mild
right foraminal narrowing.

C3-4: Disc narrowing with central protrusion contacting but not
compressing the ventral cord. Facet spurring asymmetric to the
right. Bilateral uncovertebral spurring. Foramina are patent based
on gradient images.

C4-5: Facet spurring asymmetric to the right. There is mild
anterolisthesis. Spondylosis without herniation. No impingement

C5-6: Disc narrowing and bulging with small uncovertebral spurs.
Degenerative facet spurring asymmetric to the right. There is a left
paracentral protrusion with slight ventral cord flattening. Mild to
moderate left foraminal narrowing

C6-7: Disc narrowing and bulging with a left foraminal protrusion.
Left foraminal stenosis is advanced.

C7-T1:Spondylosis.  No impingement
IMPRESSION: 1. On the symptomatic left side there is advanced degenerative
foraminal impingement at C6-7 and mild-to-moderate foraminal
stenosis at C5-6.
2. C3-4 and C5-6 disc protrusions contacting the ventral cord with
mild cord flattening at C5-6.
3. Multilevel degenerative facet spurring asymmetric to the right.
